# Patient Record
Sex: Female | Born: 1985 | Race: Black or African American | Hispanic: No | Marital: Married | Smoking: Never smoker
Health system: Southern US, Community
[De-identification: ages and names within clinical notes are randomized; demographics above are authoritative.]

## PROBLEM LIST (undated history)

## (undated) DIAGNOSIS — Z789 Other specified health status: Secondary | ICD-10-CM

## (undated) HISTORY — DX: Other specified health status: Z78.9

---

## 2021-11-23 ENCOUNTER — Other Ambulatory Visit: Payer: Self-pay

## 2021-11-23 ENCOUNTER — Emergency Department (HOSPITAL_COMMUNITY)
Admission: EM | Admit: 2021-11-23 | Discharge: 2021-11-23 | Disposition: A | Discharge status: Home / Self Care | Payer: Medicaid Other | Attending: Emergency Medicine | Admitting: Emergency Medicine

## 2021-11-23 DIAGNOSIS — Z3A01 Less than 8 weeks gestation of pregnancy: Secondary | ICD-10-CM | POA: Insufficient documentation

## 2021-11-23 DIAGNOSIS — Z79899 Other long term (current) drug therapy: Secondary | ICD-10-CM | POA: Diagnosis not present

## 2021-11-23 DIAGNOSIS — Z3491 Encounter for supervision of normal pregnancy, unspecified, first trimester: Secondary | ICD-10-CM

## 2021-11-23 DIAGNOSIS — O219 Vomiting of pregnancy, unspecified: Secondary | ICD-10-CM | POA: Diagnosis present

## 2021-11-23 LAB — POC URINE PREG, ED: Preg Test, Ur: POSITIVE — AB

## 2021-11-23 MED ORDER — DOXYLAMINE-PYRIDOXINE 10-10 MG PO TBEC: 1.0000 | DELAYED_RELEASE_TABLET | Freq: Every day | ORAL | 0 refills | Status: DC

## 2021-11-23 MED ORDER — PRENATAL COMPLETE 14-0.4 MG PO TABS: 1.0000 | ORAL_TABLET | Freq: Every day | ORAL | 0 refills | Status: DC

## 2021-11-23 NOTE — ED Provider Notes (Signed)
[facility]-EMERGENCY DEPT Provider Note   CSN: [id number] Arrival date & time: 11/23/21  1003     History  Chief Complaint  Patient presents with   Possible Pregnancy    Jeanne Williams is a 36 y.o. female.  35 yo F feels that she is likely pregnant.  Starting having morning sickness for the past week or so.  Last menstrual cycle was about 5 weeks ago.  Denies any pelvic pain vaginal bleeding urinary symptoms.  This would be her third pregnancy.  She has 2 children.  No abortions.   Possible Pregnancy      Home Medications Prior to Admission medications   Medication Sig Start Date End Date Taking? Authorizing Provider  Doxylamine-Pyridoxine 10-10 MG TBEC Take 1 tablet by mouth at bedtime. If nausea persists can take an additional pill in the morning 11/23/21  Yes Melene Plan, DO  Prenatal Vit-Fe Fumarate-FA (PRENATAL COMPLETE) 14-0.4 MG TABS Take 1 tablet by mouth daily. 11/23/21 01/22/22 Yes Melene Plan, DO      Allergies    Patient has no known allergies.    Review of Systems   Review of Systems  Physical Exam Updated Vital Signs BP 120/71    Pulse 83    Temp 98.1 F (36.7 C) (Oral)    Resp 18    Ht 5\' 5"  (1.651 m)    Wt 79.4 kg    SpO2 100%    BMI 29.12 kg/m  Physical Exam Vitals and nursing note reviewed.  Constitutional:      General: She is not in acute distress.    Appearance: She is well-developed. She is not diaphoretic.  HENT:     Head: Normocephalic and atraumatic.  Eyes:     Pupils: Pupils are equal, round, and reactive to light.  Cardiovascular:     Rate and Rhythm: Normal rate and regular rhythm.     Heart sounds: No murmur heard.   No friction rub. No gallop.  Pulmonary:     Effort: Pulmonary effort is normal.     Breath sounds: No wheezing or rales.  Abdominal:     General: There is no distension.     Palpations: Abdomen is soft.     Tenderness: There is no abdominal tenderness.  Musculoskeletal:        General: No  tenderness.     Cervical back: Normal range of motion and neck supple.  Skin:    General: Skin is warm and dry.  Neurological:     Mental Status: She is alert and oriented to person, place, and time.  Psychiatric:        Behavior: Behavior normal.    ED Results / Procedures / Treatments   Labs (all labs ordered are listed, but only abnormal results are displayed) Labs Reviewed  POC URINE PREG, ED - Abnormal; Notable for the following components:      Result Value   Preg Test, Ur POSITIVE (*)    All other components within normal limits    EKG None  Radiology No results found.  Procedures Procedures    Medications Ordered in ED Medications - No data to display  ED Course/ Medical Decision Making/ A&P                           Medical Decision Making Risk OTC drugs. Prescription drug management.   Patient is a 36 y.o. female with a cc of morning sickness.  Going  on for about a week.  She thinks that she is pregnant and took a home test that was positive.  They speaks [place] and so are looking for help getting Medicaid approval.  They are here for this primarily today.  She is well-appearing and nontoxic and has no abdominal discomfort.  Do not feel that she needs a urgent ultrasound.  We will give a prescription for prenatal vitamin diclegis.  Give her information for OB follow-up.  Social work consult placed.  Clinic care pregnancy test is positive.  10:44 AM:  I have discussed the diagnosis/risks/treatment options with the patient and family.  Evaluation and diagnostic testing in the emergency department does not suggest an emergent condition requiring admission or immediate intervention beyond what has been performed at this time.  They will follow up with  OB. We also discussed returning to the ED immediately if new or worsening sx occur. We discussed the sx which are most concerning (e.g., sudden worsening pain, fever, inability to tolerate by mouth, vaginal  bleeding, pelvic pain) that necessitate immediate return. Medications administered to the patient during their visit and any new prescriptions provided to the patient are listed below.  Medications given during this visit Medications - No data to display   The patient appears reasonably screen and/or stabilized for discharge and I doubt any other medical condition or other [facility] requiring further screening, evaluation, or treatment in the ED at this time prior to discharge.           Final Clinical Impression(s) / ED Diagnoses Final diagnoses:  First trimester pregnancy    Rx / DC Orders ED Discharge Orders          Ordered    Prenatal Vit-Fe Fumarate-FA (PRENATAL COMPLETE) 14-0.4 MG TABS  Daily        11/23/21 1021    Doxylamine-Pyridoxine 10-10 MG TBEC  Daily at bedtime        11/23/21 1021              Melene Plan, DO 11/23/21 1044

## 2021-11-23 NOTE — ED Notes (Signed)
I provided reinforced discharge education based off of discharge instructions. Pt acknowledged and understood my education. Pt had no further questions/concerns for provider/myself.  °

## 2021-11-23 NOTE — ED Triage Notes (Signed)
Patient brought in via POV from home. Concern for pregnancy

## 2021-11-23 NOTE — Discharge Instructions (Addendum)
Return for pelvic pain, vaginal bleeding.  Follow up with OB in the office.

## 2022-01-20 ENCOUNTER — Ambulatory Visit (INDEPENDENT_AMBULATORY_CARE_PROVIDER_SITE_OTHER): Payer: Medicaid Other

## 2022-01-20 ENCOUNTER — Ambulatory Visit: Payer: Medicaid Other

## 2022-01-20 ENCOUNTER — Other Ambulatory Visit (HOSPITAL_COMMUNITY)
Admission: RE | Admit: 2022-01-20 | Discharge: 2022-01-20 | Disposition: A | Discharge status: Home / Self Care | Payer: Medicaid Other | Source: Ambulatory Visit | Attending: Family Medicine | Admitting: Family Medicine

## 2022-01-20 ENCOUNTER — Other Ambulatory Visit: Payer: Self-pay

## 2022-01-20 VITALS — BP 119/80 | HR 77 | Wt 180.8 lb

## 2022-01-20 DIAGNOSIS — O0990 Supervision of high risk pregnancy, unspecified, unspecified trimester: Secondary | ICD-10-CM | POA: Insufficient documentation

## 2022-01-20 DIAGNOSIS — O09529 Supervision of elderly multigravida, unspecified trimester: Secondary | ICD-10-CM | POA: Insufficient documentation

## 2022-01-20 DIAGNOSIS — Z98891 History of uterine scar from previous surgery: Secondary | ICD-10-CM | POA: Insufficient documentation

## 2022-01-20 DIAGNOSIS — Z8759 Personal history of other complications of pregnancy, childbirth and the puerperium: Secondary | ICD-10-CM | POA: Insufficient documentation

## 2022-01-20 LAB — POCT URINALYSIS DIP (DEVICE)
Bilirubin Urine: NEGATIVE
Glucose, UA: NEGATIVE mg/dL
Ketones, ur: NEGATIVE mg/dL
Leukocytes,Ua: NEGATIVE
Nitrite: NEGATIVE
Protein, ur: NEGATIVE mg/dL
Specific Gravity, Urine: 1.02 (ref 1.005–1.030)
Urobilinogen, UA: 1 mg/dL (ref 0.0–1.0)
pH: 7.5 (ref 5.0–8.0)

## 2022-01-20 MED ORDER — BLOOD PRESSURE KIT DEVI: 1.0000 | Freq: Once | 0 refills | Status: DC

## 2022-01-20 MED ORDER — PRENATAL 28-0.8 MG PO TABS: 1.0000 | ORAL_TABLET | Freq: Every day | ORAL | 11 refills | Status: AC

## 2022-01-20 MED ORDER — BLOOD PRESSURE KIT DEVI: 1.0000 | Freq: Once | 0 refills | Status: AC

## 2022-01-20 NOTE — Patient Instructions (Addendum)
[facility] ?[address] [place], [place] [address] ?[phone] ?Hours: ?Sunday Closed ?Monday 9AM-6PM ?Tuesday 9AM-6PM ?Wednesday 9AM-6PM ?Thursday 9AM-6PM ?Friday           9AM-6PM ?Saturday         10 AM-1PM ? ? ? ?Safe Medications in Pregnancy  ? ?Acne:  ?Benzoyl Peroxide  ?Salicylic Acid  ? ?Backache/Headache:  ?Tylenol: 2 regular strength every 4 hours OR  ?             2 Extra strength every 6 hours  ? ?Colds/Coughs/Allergies:  ?Benadryl (alcohol free) 25 mg every 6 hours as needed  ?Breath right strips  ?Claritin  ?Cepacol throat lozenges  ?Chloraseptic throat spray  ?Cold-Eeze- up to three times per day  ?Cough drops, alcohol free  ?Flonase (by prescription only)  ?Guaifenesin  ?Mucinex  ?Robitussin DM (plain only, alcohol free)  ?Saline nasal spray/drops  ?Sudafed (pseudoephedrine) & Actifed * use only after [redacted] weeks gestation and if you do not have high blood pressure  ?Tylenol  ?Vicks Vaporub  ?Zinc lozenges  ?Zyrtec  ? ?Constipation:  ?Colace  ?Ducolax suppositories  ?Fleet enema  ?Glycerin suppositories  ?Metamucil  ?Milk of magnesia  ?Miralax  ?Senokot  ?Smooth move tea  ? ?Diarrhea:  ?Kaopectate  ?Imodium A-D  ? ?*NO pepto Bismol  ? ?Hemorrhoids:  ?Anusol  ?Anusol HC  ?Preparation H  ?Tucks  ? ?Indigestion:  ?Tums  ?Maalox  ?Mylanta  ?Zantac  ?Pepcid  ? ?Insomnia:  ?Benadryl (alcohol free) 25mg  every 6 hours as needed  ?Tylenol PM  ?Unisom, no Gelcaps  ? ?Leg Cramps:  ?Tums  ?MagGel  ? ?Nausea/Vomiting:  ?Bonine  ?Dramamine  ?Emetrol  ?Ginger extract  ?Sea bands  ?Meclizine  ?Nausea medication to take during pregnancy:  ?Unisom (doxylamine succinate 25 mg tablets) Take one tablet daily at bedtime. If symptoms are not adequately controlled, the dose can be increased to a maximum recommended dose of two tablets daily (1/2 tablet in the morning, 1/2 tablet mid-afternoon and one at bedtime).  ?Vitamin B6 100mg  tablets. Take one tablet twice a day (up to 200 mg per day).  ? ?Skin Rashes:  ?Aveeno  products  ?Benadryl cream or 25mg  every 6 hours as needed  ?Calamine Lotion  ?1% cortisone cream  ? ?Yeast infection:  ?Gyne-lotrimin 7  ?Monistat 7  ? ? ?**If taking multiple medications, please check labels to avoid duplicating the same active ingredients  ?**take medication as directed on the label  ?** Do not exceed 4000 mg of tylenol in 24 hours  ?**Do not take medications that contain aspirin or ibuprofen  ?    ?   ?  ?

## 2022-01-20 NOTE — Progress Notes (Signed)
New OB Intake ? ?Pessel Serven is here today in person for appointment. I explained I am completing New OB Intake today. Encounter completed with [employer] virtual interpreter Benjamine Mola ID [phone] and [employer] in-person interpreter Mine [place]te.  We discussed her EDD of 07/22/22 that is based on LMP of 10/15/21. Pt is G3/P2002. I reviewed her allergies, medications, Medical/Surgical/OB history, and appropriate screenings. I informed her of [facility] services. Based on history, this is a high risk pregnancy. ? ?Patient Active Problem List  ? Diagnosis Date Noted  ? Supervision of high risk pregnancy, antepartum 01/20/2022  ? History of C-section 01/20/2022  ? History of prior pregnancy with SGA newborn 01/20/2022  ? AMA (advanced maternal age) multigravida 35+ 01/20/2022  ? ?Concerns addressed today ? ?Patient reports seasonal allergy symptoms and asks which medication is okay to take while pregnant. List provided in AVS for over the counter medications. ? ?Delivery Plans:  ?Plans to deliver at [facility]. Will discuss if VBAC is option with provider at new OB appt. Pt reports prior c-section was performed because her cervix did not dilate. ? ?MyChart/Babyscripts ?Not initiated due to language barrier and time constraints of appt. ? ?Blood Pressure Cuff  ?Blood pressure cuff ordered for patient to pick-up from [employer]. Explained after first prenatal appt pt will check weekly. ? ?Anatomy US ?Explained first scheduled [place] will be around 19 weeks. Anatomy US scheduled for 02/26/22 at 1015. ? ?Labs ?Discussed Johnsie Cancel genetic screening with patient. Would like both Panorama and Horizon drawn. Routine prenatal labs with A1C drawn today.  ? ?COVID Vaccine ?Not reviewed. ? ?Is patient a CenteringPregnancy candidate? Not a candidate due to language barrier. ?  ?Is patient a Mom+Baby Combined Care candidate? Not a candidate; two living children. ? ?Social Determinants of Health ?Food Insecurity: Patient denies food  insecurity. ?WIC Referral: Patient will be offered [facility] referral by prenatal navigator at first appt.  ?Transportation: Patient denies transportation needs. ? ?First visit review ?I reviewed new OB appt with pt. I explained she will have a provider visit with physical exam. Patient will need PAP smear at first visit. Explained pt will be seen by Radene Gunning, MD at first visit; encounter routed to appropriate provider. ? ?Annabell Howells, RN ?01/20/2022  12:00 PM ? ? ?

## 2022-01-21 LAB — CBC/D/PLT+RPR+RH+ABO+RUBIGG...
Antibody Screen: NEGATIVE
Basophils Absolute: 0 10*3/uL (ref 0.0–0.2)
Basos: 1 %
EOS (ABSOLUTE): 0.3 10*3/uL (ref 0.0–0.4)
Eos: 5 %
HCV Ab: NONREACTIVE
HIV Screen 4th Generation wRfx: NONREACTIVE
Hematocrit: 37.6 % (ref 34.0–46.6)
Hemoglobin: 12.4 g/dL (ref 11.1–15.9)
Immature Grans (Abs): 0 10*3/uL (ref 0.0–0.1)
Immature Granulocytes: 0 %
Lymphocytes Absolute: 1.4 10*3/uL (ref 0.7–3.1)
Lymphs: 26 %
MCH: 30.8 pg (ref 26.6–33.0)
MCHC: 33 g/dL (ref 31.5–35.7)
MCV: 94 fL (ref 79–97)
Monocytes Absolute: 0.4 10*3/uL (ref 0.1–0.9)
Monocytes: 8 %
Neutrophils Absolute: 3.5 10*3/uL (ref 1.4–7.0)
Neutrophils: 60 %
Platelets: 207 10*3/uL (ref 150–450)
RBC: 4.02 x10E6/uL (ref 3.77–5.28)
RDW: 13.9 % (ref 11.7–15.4)
RPR Ser Ql: NONREACTIVE
Rh Factor: POSITIVE
Rubella Antibodies, IGG: 26.6 index (ref >0.99)
WBC: 5.7 10*3/uL (ref 3.4–10.8)

## 2022-01-21 LAB — GC/CHLAMYDIA PROBE AMP (~~LOC~~) NOT AT ARMC
Chlamydia: NEGATIVE
Comment: NEGATIVE
Comment: NORMAL
Neisseria Gonorrhea: NEGATIVE

## 2022-01-21 LAB — HEMOGLOBIN A1C
Est. average glucose Bld gHb Est-mCnc: 103 mg/dL
Hgb A1c MFr Bld: 5.2 % (ref 4.8–5.6)

## 2022-01-21 LAB — HCV INTERPRETATION

## 2022-01-21 LAB — HEPATITIS B SURFACE AG, CONFIRM: HBsAG Confirmation: POSITIVE — AB

## 2022-01-22 ENCOUNTER — Other Ambulatory Visit: Payer: Self-pay | Admitting: Obstetrics and Gynecology

## 2022-01-22 DIAGNOSIS — B1910 Unspecified viral hepatitis B without hepatic coma: Secondary | ICD-10-CM

## 2022-01-22 LAB — URINE CULTURE, OB REFLEX

## 2022-01-22 LAB — CULTURE, OB URINE

## 2022-01-25 ENCOUNTER — Other Ambulatory Visit: Payer: Self-pay | Admitting: *Deleted

## 2022-01-25 ENCOUNTER — Telehealth: Payer: Self-pay | Admitting: *Deleted

## 2022-01-25 ENCOUNTER — Other Ambulatory Visit: Payer: Self-pay

## 2022-01-25 DIAGNOSIS — B1910 Unspecified viral hepatitis B without hepatic coma: Secondary | ICD-10-CM

## 2022-01-25 NOTE — Telephone Encounter (Addendum)
-----   Message from Milas Hock, MD sent at 01/22/2022 11:40 AM EDT ----- ?Pt is positive for Hep B surface antigen. Can you please check with her about known history? We also need some additional labs. I will place those - I don't know if they can be added on or need to be done at future visit.  ? ?4/3  1720  Additional labs have been requested as ordered. Called pt w/[employer] interpreter [phone].  She did not answer and does not have voicemail. Pt needs to be questioned about any hepatitis history.  ?

## 2022-02-01 LAB — HEPATIC FUNCTION PANEL
ALT: 14 IU/L (ref 0–32)
AST: 21 IU/L (ref 0–40)
Albumin: 3.9 g/dL (ref 3.8–4.8)
Alkaline Phosphatase: 59 IU/L (ref 44–121)
Bilirubin Total: 0.2 mg/dL (ref 0.0–1.2)
Bilirubin, Direct: <0.1 mg/dL (ref 0.00–0.40)
Total Protein: 6.6 g/dL (ref 6.0–8.5)

## 2022-02-01 LAB — HEPATITIS B DNA, ULTRAQUANTITATIVE, PCR

## 2022-02-01 LAB — HEPATITIS B E ANTIBODY: Hep B E Ab: POSITIVE — AB

## 2022-02-01 LAB — SPECIMEN STATUS REPORT

## 2022-02-01 LAB — HEPATITIS B E ANTIGEN: Hep B E Ag: NEGATIVE

## 2022-02-01 NOTE — Progress Notes (Signed)
? ?History:  ? Jeanne Williams is a 36 y.o. G3P2002 at [redacted]w[redacted]d by LMP being seen today for her first obstetrical visit.   Patient does intend to breast feed.  ? ?Pregnancy history fully reviewed.  ? ?Obstetrical history is significant for two prior c-sections, FGR, AMA, and hepatitis B.  ? ?Patient reports no complaints. ?  ? ?  ?HISTORY: ?OB History  ?Gravida Para Term Preterm AB Living  ?3 2 2  0 0 2  ?SAB IAB Ectopic Multiple Live Births  ?0 0 0 0 2  ?  ?# Outcome Date GA Lbr Len/2nd Weight Sex Delivery Anes PTL Lv  ?3 Current           ?2 Term 05/20/20 [redacted]w[redacted]d  4 lb 10.1 oz (2.1 kg) F CS-Unspec     ?1 Term 01/19/13 [redacted]w[redacted]d  5 lb 8.2 oz (2.5 kg) F CS-Unspec  Y   ?   Complications: Failure to Progress in First Stage  ?  ? ?Last pap smear :  ?No results found for: DIAGPAP, HPV, HPVHIGH ? ? ?History reviewed. No pertinent past medical history. ?History reviewed. No pertinent surgical history. ?History reviewed. No pertinent family history. ?Social History  ? ?Tobacco Use  ? Smoking status: Never  ? Smokeless tobacco: Never  ?Substance Use Topics  ? Drug use: Never  ? ?No Known Allergies ?Current Outpatient Medications on File Prior to Visit  ?Medication Sig Dispense Refill  ? Prenatal 28-0.8 MG TABS Take 1 tablet by mouth daily. 30 tablet 11  ? ?No current facility-administered medications on file prior to visit.  ? ? ?Review of Systems ?Pertinent items noted in HPI and remainder of comprehensive ROS otherwise negative. ? ?Physical Exam:  ? ?Vitals:  ? 02/04/22 0857  ?BP: 114/85  ?Pulse: 74  ?Weight: 181 lb (82.1 kg)  ? ?Fetal Heart Rate (bpm): 160 ? ?General: well-developed, well-nourished female in no acute distress  ?Breasts:  Deferred   ?Skin: normal coloration and turgor, no rashes  ?Neurologic: oriented, normal, negative, normal mood  ?Extremities: normal strength, tone, and muscle mass, ROM of all joints is normal  ?HEENT PERRLA, extraocular movement intact and sclera clear, anicteric  ?Neck supple and no masses   ?Cardiovascular: regular rate and rhythm  ?Respiratory:  no respiratory distress, normal breath sounds  ?Abdomen: soft, non-tender; bowel sounds normal; no masses,  no organomegaly  ?Pelvic: Deferred  ?  ?Assessment:  ?  ?Pregnancy: G1P0 ?Patient Active Problem List  ? Diagnosis Date Noted  ? Supervision of high risk pregnancy, antepartum 01/20/2022  ? History of C-section 01/20/2022  ? History of prior pregnancy with SGA newborn 01/20/2022  ? AMA (advanced maternal age) multigravida 35+ 01/20/2022  ? ?  ?Plan:  ?  ?1. Supervision of high risk pregnancy, antepartum ?Anatomy scheduled for 5/5 ?Will do MSAFP ?Check HBV labs and refer to ID. Appt made ? ?2. History of C-section ?- We discussed her history of c-section.  Prior c-section due to lack of dilation per pt report. Second c-section was scheduled.  .  She has a history of  no prior successful vaginal deliveries ?- We discussed the risks associated with repeat c-section: bleeding, infection, injury to surrounding organs/tissues I.e. bowel/bladder, development of scar tissue, wound complications such as wound separation or infection, need for additional surgery, percreta/acreta ?- We discussed the risks associated with TOLAC: risk of it being unsuccessful, specially in the context of her history, the risks in general of a vaginal delivery (prolapse, SUI, differences in recovery, pelvic  floor dysfunction, etc), and the risk of uterine rupture. We discussed with the risk of uterine rupture that while rare it is not easily predicted, that it is a surgical emergency, and it can be potentially catastrophic for mom and baby. We discussed if uterine rupture that it may necessitate hysterectomy if the rupture caused issues with bleeding that could not be managed with other surgical options.  ?- After counseling, the patient was given the opportunity to ask questions and all questions answered.  ?- After considering her options, she would like take some time to consider  the options ?- Information provided to the patient ? ? ?3. History of prior pregnancy with SGA newborn ?Will follow serial growth in third trimester ? ?4. Antepartum multigravida of advanced maternal age ?NIPT wnl. Result not yet in the chart but reviewed by Marylynn Pearson, RN.  ?Continue prenatal vitamins. ?Problem list reviewed and updated. ?Genetic Screening discussed, NIPS: ordered and normal ?Ultrasound discussed; fetal anatomic survey:  scheduled for 5/5.  ?Anticipatory guidance about prenatal visits given including labs, ultrasounds, and testing. ?Discussed usage of Babyscripts and virtual visits ? ?The nature of [facility]Faculty Practice with multiple MDs and Advanced Practice Providers was explained to patient; also emphasized that residents, students are part of our team. ?Routine obstetric precautions reviewed. Encouraged to seek out care at office or emergency room [facility] preferred) for urgent and/or emergent concerns. ?Return in about 4 weeks (around 03/04/2022) for OB VISIT, MD only.  ?  ?Milas Hock, MD, FACOG ?Obstetrician Heritage manager, Faculty Practice ?Center for [employer], [facility] ? ?

## 2022-02-02 NOTE — Telephone Encounter (Signed)
Called patient with [facility] interpreter [place] ID [phone]. Pt reports no known history of hepatitis. Desires to complete needed labs at upcoming appt. Pt expresses concern about hepatitis. Explained this is infection of the liver. Explained her provider will talk more with her about treatment for this and how this will effect care needed during pregnancy. ?

## 2022-02-04 ENCOUNTER — Encounter: Payer: Self-pay | Admitting: Obstetrics and Gynecology

## 2022-02-04 ENCOUNTER — Ambulatory Visit (INDEPENDENT_AMBULATORY_CARE_PROVIDER_SITE_OTHER): Payer: Self-pay | Admitting: Obstetrics and Gynecology

## 2022-02-04 VITALS — BP 114/85 | HR 74 | Wt 181.0 lb

## 2022-02-04 DIAGNOSIS — O09529 Supervision of elderly multigravida, unspecified trimester: Secondary | ICD-10-CM

## 2022-02-04 DIAGNOSIS — O0990 Supervision of high risk pregnancy, unspecified, unspecified trimester: Secondary | ICD-10-CM

## 2022-02-04 DIAGNOSIS — O98412 Viral hepatitis complicating pregnancy, second trimester: Secondary | ICD-10-CM

## 2022-02-04 DIAGNOSIS — Z8759 Personal history of other complications of pregnancy, childbirth and the puerperium: Secondary | ICD-10-CM

## 2022-02-04 DIAGNOSIS — B1910 Unspecified viral hepatitis B without hepatic coma: Secondary | ICD-10-CM

## 2022-02-04 DIAGNOSIS — Z98891 History of uterine scar from previous surgery: Secondary | ICD-10-CM

## 2022-02-04 MED ORDER — DOXYLAMINE-PYRIDOXINE 10-10 MG PO TBEC
1.0000 | DELAYED_RELEASE_TABLET | Freq: Every day | ORAL | 0 refills | Status: DC
Start: 2022-02-04 — End: 2022-04-26

## 2022-02-04 NOTE — Patient Instructions (Signed)
[facility] ?Address: [address], [place], [place] [address] ?Phone: [phone] ? ?Appointment Tuesday, April 18th at 10:00am ?

## 2022-02-06 LAB — AFP, SERUM, OPEN SPINA BIFIDA
AFP MoM: 1.63
AFP Value: 55.1 ng/mL
Gest. Age on Collection Date: 16 weeks
Maternal Age At EDD: 35.2 yr
OSBR Risk 1 IN: 3889
Test Results:: NEGATIVE
Weight: 181 [lb_av]

## 2022-02-06 LAB — HEPATITIS B DNA, ULTRAQUANTITATIVE, PCR
HBV DNA SERPL PCR-ACNC: 220 IU/mL
HBV DNA SERPL PCR-LOG IU: 2.342 log10 IU/mL

## 2022-02-09 ENCOUNTER — Ambulatory Visit (INDEPENDENT_AMBULATORY_CARE_PROVIDER_SITE_OTHER): Payer: Medicaid Other | Admitting: Internal Medicine

## 2022-02-09 ENCOUNTER — Other Ambulatory Visit: Payer: Self-pay

## 2022-02-09 ENCOUNTER — Encounter: Payer: Self-pay | Admitting: Internal Medicine

## 2022-02-09 DIAGNOSIS — B181 Chronic viral hepatitis B without delta-agent: Secondary | ICD-10-CM | POA: Diagnosis present

## 2022-02-09 NOTE — Progress Notes (Signed)
?    [facility]  ? ? ? ? ?Reason for Consult: chronic hepatitis B    ?Referring Physician: Dr. Para March ? ? ? Patient ID: Jeanne Williams, female    DOB: 1986/02/14, 36 y.o.   MRN: [id number] ? ?HPI:   ?She is here for evaluation of known positive hepatitis B.  ?This is her third pregnancy and no records of previous pregnancies available. Her previous two pregnancies were in her home country and she was never told anything about being hepatitis B positive or knows of any testing.  She is unaware of her mother having hepatitis B and is living in [place].  No known liver issues in her family, no known cirrhosis or hepatocellular carcinoma.  No history of testing or any blood transfusions.  AST and ALT wnl.  HIV negative.  ? ?PMH: chronic hepatitis B ? ?Prior to Admission medications   ?Medication Sig Start Date End Date Taking? Authorizing Provider  ?Doxylamine-Pyridoxine 10-10 MG TBEC Take 1 tablet by mouth at bedtime. If nausea persists can take an additional pill in the morning 02/04/22   Milas Hock, MD  ?Prenatal 28-0.8 MG TABS Take 1 tablet by mouth daily. 01/20/22   Milas Hock, MD  ? ? ?No Known Allergies ? ?Social History  ? ?Tobacco Use  ? Smoking status: Never  ? Smokeless tobacco: Never  ?Substance Use Topics  ? Drug use: Never  ? ?FMH: no liver disease ? ?Review of Systems ? Constitutional: negative for fatigue and malaise ?Integument/breast: negative for rash ?All other systems reviewed and are negative   ? ?Constitutional: in no apparent distress There were no vitals filed for this visit. ?EYES: anicteric ?Respiratory: normal respiratory effort ?GI: gravid ?Musculoskeletal: no edema ?Skin: no rash ? ?Labs: ?Lab Results  ?Component Value Date  ? WBC 5.7 01/20/2022  ? HGB 12.4 01/20/2022  ? HCT 37.6 01/20/2022  ? MCV 94 01/20/2022  ? PLT 207 01/20/2022  ? No results found for: CREATININE, BUN, NA, K, CL, CO2  ?Lab Results  ?Component Value Date  ? ALT 14 01/20/2022  ? AST 21  01/20/2022  ? ALKPHOS 59 01/20/2022  ? BILITOT 0.2 01/20/2022  ?  ? ?Assessment: chronic, inactive hepatitis B.  I discussed the natural history of hepatitis B, indications for treatment of the mother in pregnancy and indications for treatment after pregnancy.  I also discussed the risk of HCC and need for screening. ?At this time, her viral load is minimal so no indication for treatment of the mother.   ? ?Plan: ?1)  rtc in 10 weeks for repeat HBV DNA, hepatic function panel and will check HDV ?She can then return after pregnancy for routine monitoring.  ?

## 2022-02-10 ENCOUNTER — Telehealth: Payer: Self-pay | Admitting: *Deleted

## 2022-02-10 NOTE — Telephone Encounter (Addendum)
-----   Message from Radene Gunning, MD sent at 02/10/2022  8:46 AM EDT ----- ?Please inform pt of normal MSAFP - thanks, pad ? ?4/19  1346  Called pt and heard message stating that the call cannot be completed @ this time. Pt does not have voicemail or Mychart set up. She needs to be informed of normal MSAFP, Panorama and Horizon tests.  ?

## 2022-02-10 NOTE — Telephone Encounter (Signed)
Called pt with [place] # [phone] and pt informed of normal results. Pt verbalized understanding.  ? ?Glenham Antolin,RN  ?02/10/22 ?

## 2022-02-26 ENCOUNTER — Other Ambulatory Visit: Payer: Self-pay | Admitting: *Deleted

## 2022-02-26 ENCOUNTER — Ambulatory Visit: Payer: Medicaid Other | Attending: Obstetrics and Gynecology

## 2022-02-26 ENCOUNTER — Encounter: Payer: Self-pay | Admitting: *Deleted

## 2022-02-26 ENCOUNTER — Ambulatory Visit: Payer: Medicaid Other | Admitting: *Deleted

## 2022-02-26 VITALS — BP 114/67 | HR 78

## 2022-02-26 DIAGNOSIS — O0990 Supervision of high risk pregnancy, unspecified, unspecified trimester: Secondary | ICD-10-CM

## 2022-02-26 DIAGNOSIS — O09522 Supervision of elderly multigravida, second trimester: Secondary | ICD-10-CM

## 2022-02-26 DIAGNOSIS — O09529 Supervision of elderly multigravida, unspecified trimester: Secondary | ICD-10-CM | POA: Diagnosis present

## 2022-02-26 DIAGNOSIS — Z8759 Personal history of other complications of pregnancy, childbirth and the puerperium: Secondary | ICD-10-CM | POA: Insufficient documentation

## 2022-02-26 DIAGNOSIS — O4442 Low lying placenta NOS or without hemorrhage, second trimester: Secondary | ICD-10-CM

## 2022-02-26 DIAGNOSIS — O34219 Maternal care for unspecified type scar from previous cesarean delivery: Secondary | ICD-10-CM

## 2022-03-01 ENCOUNTER — Encounter: Payer: Self-pay | Admitting: Obstetrics and Gynecology

## 2022-03-01 DIAGNOSIS — O4440 Low lying placenta NOS or without hemorrhage, unspecified trimester: Secondary | ICD-10-CM | POA: Insufficient documentation

## 2022-03-03 ENCOUNTER — Other Ambulatory Visit: Payer: Self-pay | Admitting: *Deleted

## 2022-03-03 DIAGNOSIS — O09522 Supervision of elderly multigravida, second trimester: Secondary | ICD-10-CM

## 2022-03-03 DIAGNOSIS — O34219 Maternal care for unspecified type scar from previous cesarean delivery: Secondary | ICD-10-CM

## 2022-03-03 DIAGNOSIS — Z8759 Personal history of other complications of pregnancy, childbirth and the puerperium: Secondary | ICD-10-CM

## 2022-03-03 DIAGNOSIS — O4440 Low lying placenta NOS or without hemorrhage, unspecified trimester: Secondary | ICD-10-CM

## 2022-03-04 ENCOUNTER — Other Ambulatory Visit (HOSPITAL_COMMUNITY)
Admission: RE | Admit: 2022-03-04 | Discharge: 2022-03-04 | Disposition: A | Discharge status: Home / Self Care | Payer: Medicaid Other | Source: Ambulatory Visit | Attending: Family Medicine | Admitting: Family Medicine

## 2022-03-04 ENCOUNTER — Ambulatory Visit (INDEPENDENT_AMBULATORY_CARE_PROVIDER_SITE_OTHER): Payer: Medicaid Other | Admitting: Family Medicine

## 2022-03-04 VITALS — BP 115/72 | HR 72 | Wt 183.4 lb

## 2022-03-04 DIAGNOSIS — Z98891 History of uterine scar from previous surgery: Secondary | ICD-10-CM

## 2022-03-04 DIAGNOSIS — Z124 Encounter for screening for malignant neoplasm of cervix: Secondary | ICD-10-CM | POA: Insufficient documentation

## 2022-03-04 DIAGNOSIS — B181 Chronic viral hepatitis B without delta-agent: Secondary | ICD-10-CM

## 2022-03-04 DIAGNOSIS — O0990 Supervision of high risk pregnancy, unspecified, unspecified trimester: Secondary | ICD-10-CM

## 2022-03-04 DIAGNOSIS — O09522 Supervision of elderly multigravida, second trimester: Secondary | ICD-10-CM

## 2022-03-04 DIAGNOSIS — O4440 Low lying placenta NOS or without hemorrhage, unspecified trimester: Secondary | ICD-10-CM

## 2022-03-04 NOTE — Patient Instructions (Signed)
For cold and allergy symptoms (blocked nose) ? ?You may take Mucinex, (Allegra, Claritin, Zyrtec, Xyzal, Clarinex-any one of these--they are the same class--same as Benadryl), Sudafed (must be behind the counter, should not be phenylephrine), saline or steroid (Nasacort, Nasonex and Flonase) nasal sprays. ? ?Try Heat to back and tylenol ?

## 2022-03-04 NOTE — Progress Notes (Signed)
? ?  PRENATAL VISIT NOTE ? ?Subjective:  ?Jeanne Williams is a 36 y.o. G3P2002 at [redacted]w[redacted]d being seen today for ongoing prenatal care.  She is currently monitored for the following issues for this low-risk pregnancy and has Supervision of high risk pregnancy, antepartum; History of C-section; History of prior pregnancy with SGA newborn; AMA (advanced maternal age) multigravida 35+; Chronic viral hepatitis B without delta-agent (HCC); and Low-lying placenta on their problem list. ? ?Patient reports no complaints.  Contractions: Not present. Vag. Bleeding: None.  Movement: Present. Denies leaking of fluid.  ? ?The following portions of the patient's history were reviewed and updated as appropriate: allergies, current medications, past family history, past medical history, past social history, past surgical history and problem list.  ? ?Objective:  ? ?Vitals:  ? 03/04/22 1040  ?BP: 115/72  ?Pulse: 72  ?Weight: 183 lb 6.4 oz (83.2 kg)  ? ? ?Fetal Status: Fetal Heart Rate (bpm): 156 Fundal Height: 22 cm Movement: Present    ? ?General:  Alert, oriented and cooperative. Patient is in no acute distress.  ?Skin: Skin is warm and dry. No rash noted.   ?Cardiovascular: Normal heart rate noted  ?Respiratory: Normal respiratory effort, no problems with respiration noted  ?Abdomen: Soft, gravid, appropriate for gestational age.  Pain/Pressure: Present     ?Pelvic: Cervical exam deferred        ?Extremities: Normal range of motion.  Edema: None  ?Mental Status: Normal mood and affect. Normal behavior. Normal judgment and thought content.  ? ?Assessment and Plan:  ?Pregnancy: [id number] at [redacted]w[redacted]d ?1. Chronic viral hepatitis B without delta agent and without coma (HCC) ?Has seen ID--has f/u ? ?2. Multigravida of advanced maternal age in second trimester ?LR NIPT ? ?3. History of C-section ?Likely for RCS ? ?4. Low-lying placenta ?Will need repeat u/s ?Bleeding precautions ? ?5. Supervision of high risk pregnancy, antepartum ?Continue  routine prenatal care. ? ?6. Papanicolaou smear for cervical cancer screening ?- Cytology - PAP( [place]) ? ?Preterm labor symptoms and general obstetric precautions including but not limited to vaginal bleeding, contractions, leaking of fluid and fetal movement were reviewed in detail with the patient. ?Please refer to After Visit Summary for other counseling recommendations.  ? ?Return in about 4 weeks (around 04/01/2022) for [facility]. ? ?Future Appointments  ?Date Time Provider Department Center  ?03/26/2022  9:15 AM [facility] NURSE [facility]  ?03/26/2022  9:30 AM [facility] US2 [facility]  ?04/07/2022  9:35 AM Warden Fillers, MD [facility]  ?04/21/2022  4:00 PM Comer, Belia Heman, MD [facility]  ?04/23/2022 10:45 AM [facility] NURSE [facility]  ?04/23/2022 11:00 AM [facility] US1 [facility]  ? ? ?Reva Bores, MD ? ?

## 2022-03-08 LAB — CYTOLOGY - PAP
Adequacy: ABSENT
Comment: NEGATIVE
Diagnosis: NEGATIVE
High risk HPV: NEGATIVE

## 2022-03-26 ENCOUNTER — Ambulatory Visit: Payer: Medicaid Other | Admitting: *Deleted

## 2022-03-26 ENCOUNTER — Encounter: Payer: Self-pay | Admitting: *Deleted

## 2022-03-26 ENCOUNTER — Ambulatory Visit: Payer: Medicaid Other | Attending: Maternal & Fetal Medicine

## 2022-03-26 ENCOUNTER — Other Ambulatory Visit: Payer: Self-pay | Admitting: *Deleted

## 2022-03-26 VITALS — BP 95/53 | HR 81

## 2022-03-26 DIAGNOSIS — B1910 Unspecified viral hepatitis B without hepatic coma: Secondary | ICD-10-CM

## 2022-03-26 DIAGNOSIS — O34219 Maternal care for unspecified type scar from previous cesarean delivery: Secondary | ICD-10-CM | POA: Diagnosis not present

## 2022-03-26 DIAGNOSIS — O09522 Supervision of elderly multigravida, second trimester: Secondary | ICD-10-CM

## 2022-03-26 DIAGNOSIS — O0990 Supervision of high risk pregnancy, unspecified, unspecified trimester: Secondary | ICD-10-CM | POA: Diagnosis present

## 2022-03-26 DIAGNOSIS — O09292 Supervision of pregnancy with other poor reproductive or obstetric history, second trimester: Secondary | ICD-10-CM | POA: Diagnosis not present

## 2022-03-26 DIAGNOSIS — O98412 Viral hepatitis complicating pregnancy, second trimester: Secondary | ICD-10-CM

## 2022-03-26 DIAGNOSIS — Z3A23 23 weeks gestation of pregnancy: Secondary | ICD-10-CM

## 2022-03-26 DIAGNOSIS — Z8759 Personal history of other complications of pregnancy, childbirth and the puerperium: Secondary | ICD-10-CM

## 2022-03-26 DIAGNOSIS — O4442 Low lying placenta NOS or without hemorrhage, second trimester: Secondary | ICD-10-CM | POA: Diagnosis not present

## 2022-04-07 ENCOUNTER — Ambulatory Visit (INDEPENDENT_AMBULATORY_CARE_PROVIDER_SITE_OTHER): Payer: Medicaid Other | Admitting: Obstetrics and Gynecology

## 2022-04-07 VITALS — BP 115/74 | HR 82 | Wt 186.0 lb

## 2022-04-07 DIAGNOSIS — B181 Chronic viral hepatitis B without delta-agent: Secondary | ICD-10-CM

## 2022-04-07 DIAGNOSIS — Z8759 Personal history of other complications of pregnancy, childbirth and the puerperium: Secondary | ICD-10-CM

## 2022-04-07 DIAGNOSIS — Z98891 History of uterine scar from previous surgery: Secondary | ICD-10-CM

## 2022-04-07 DIAGNOSIS — O09522 Supervision of elderly multigravida, second trimester: Secondary | ICD-10-CM

## 2022-04-07 DIAGNOSIS — Z3A24 24 weeks gestation of pregnancy: Secondary | ICD-10-CM

## 2022-04-07 DIAGNOSIS — O0990 Supervision of high risk pregnancy, unspecified, unspecified trimester: Secondary | ICD-10-CM

## 2022-04-07 NOTE — Progress Notes (Signed)
   PRENATAL VISIT NOTE  Subjective:  Jeanne Williams is a 36 y.o. G3P2002 at [redacted]w[redacted]d being seen today for ongoing prenatal care.  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  LLP has resolved.  Patient doing well with no acute concerns today. She reports no complaints.  Contractions: Not present. Vag. Bleeding: None.  Movement: Present. Denies leaking of fluid.   The following portions of the patient's history were reviewed and updated as appropriate: allergies, current medications, past family history, past medical history, past social history, past surgical history and problem list. Problem list updated.  Objective:   Vitals:   04/07/22 0941  BP: 115/74  Pulse: 82  Weight: 186 lb (84.4 kg)    Fetal Status: Fetal Heart Rate (bpm): 158 Fundal Height: 24 cm Movement: Present     General:  Alert, oriented and cooperative. Patient is in no acute distress.  Skin: Skin is warm and dry. No rash noted.   Cardiovascular: Normal heart rate noted  Respiratory: Normal respiratory effort, no problems with respiration noted  Abdomen: Soft, gravid, appropriate for gestational age.  Pain/Pressure: Present     Pelvic: Cervical exam deferred        Extremities: Normal range of motion.     Mental Status:  Normal mood and affect. Normal behavior. Normal judgment and thought content.   Assessment and Plan:  Pregnancy: G3P2002 at [redacted]w[redacted]d  1. [redacted] weeks gestation of pregnancy   2. Chronic viral hepatitis B without delta agent and without coma (HCC) Appears to have relatively low viral RNA, pt followed by ID  3. Supervision of high risk pregnancy, antepartum Continue routine prenatal care 28 week labs next  visit  4. History of C-section Hx of c/s x 2, pt desires repeat with BTL, sign both consents at next visit  5. History of prior pregnancy with SGA newborn Pt has growth scan on 04/23/22  6. Multigravida of advanced maternal age in second trimester   Preterm labor symptoms and general obstetric precautions including but not limited to vaginal bleeding, contractions, leaking of fluid and fetal movement were reviewed in detail with the patient.  Please refer to After Visit Summary for other counseling recommendations.   Return in about 3 weeks (around 04/28/2022) for [facility], in person, 2 hr GTT, 3rd trim labs.   Lynnda Shields, MD Faculty Attending Center for [facility]

## 2022-04-14 ENCOUNTER — Other Ambulatory Visit: Payer: Self-pay | Admitting: Obstetrics and Gynecology

## 2022-04-21 ENCOUNTER — Other Ambulatory Visit: Payer: Self-pay

## 2022-04-21 ENCOUNTER — Ambulatory Visit (INDEPENDENT_AMBULATORY_CARE_PROVIDER_SITE_OTHER): Payer: Medicaid Other | Admitting: Internal Medicine

## 2022-04-21 ENCOUNTER — Encounter: Payer: Self-pay | Admitting: Internal Medicine

## 2022-04-21 VITALS — BP 109/73 | HR 77 | Temp 97.9°F | Wt 185.0 lb

## 2022-04-21 DIAGNOSIS — B181 Chronic viral hepatitis B without delta-agent: Secondary | ICD-10-CM

## 2022-04-22 ENCOUNTER — Encounter: Payer: Self-pay | Admitting: Internal Medicine

## 2022-04-22 NOTE — Assessment & Plan Note (Addendum)
Will recheck her DNA today to assure it is still low/minimal.  She will then follow up after delivery for labs and will do an elastography at that time. Will check a delta Ab as well.   I have personally spent 20 minutes involved in face-to-face and non-face-to-face activities for this patient on the day of the visit. Professional time spent includes the following activities: Preparing to see the patient (review of tests), Obtaining and/or reviewing separately obtained history (admission/discharge record), Performing a medically appropriate examination and/or evaluation , Ordering medications/tests/procedures, referring and communicating with other health care professionals, Documenting clinical information in the EMR, Independently interpreting results (not separately reported), Communicating results to the patient/family/caregiver, Counseling and educating the patient/family/caregiver and Care coordination (not separately reported).

## 2022-04-22 NOTE — Progress Notes (Signed)
   Subjective:    Patient ID: Jeanne Williams, female    DOB: 10-Jun-1986, 36 y.o.   MRN: [id number]  HPI Here for follow up of chronic inactive hepatitis B She is pregnant and is E Ag negative, no transaminitis and HBV DNA on 220 IU/mL.  No treatment indicated.   Here for follow up.  No new issues.    Review of Systems  Constitutional:  Negative for fatigue.  Gastrointestinal:  Negative for diarrhea and nausea.  Skin:  Negative for rash.       Objective:   Physical Exam Eyes:     General: No scleral icterus. Pulmonary:     Effort: Pulmonary effort is normal.  Neurological:     Mental Status: She is alert.  Psychiatric:        Mood and Affect: Mood normal.           Assessment & Plan:

## 2022-04-23 ENCOUNTER — Ambulatory Visit: Payer: Medicaid Other | Admitting: *Deleted

## 2022-04-23 ENCOUNTER — Ambulatory Visit: Payer: Medicaid Other | Attending: Maternal & Fetal Medicine

## 2022-04-23 ENCOUNTER — Other Ambulatory Visit: Payer: Self-pay | Admitting: *Deleted

## 2022-04-23 VITALS — BP 107/55 | HR 68

## 2022-04-23 DIAGNOSIS — O09292 Supervision of pregnancy with other poor reproductive or obstetric history, second trimester: Secondary | ICD-10-CM | POA: Diagnosis not present

## 2022-04-23 DIAGNOSIS — O98412 Viral hepatitis complicating pregnancy, second trimester: Secondary | ICD-10-CM | POA: Diagnosis not present

## 2022-04-23 DIAGNOSIS — O0990 Supervision of high risk pregnancy, unspecified, unspecified trimester: Secondary | ICD-10-CM | POA: Insufficient documentation

## 2022-04-23 DIAGNOSIS — O09522 Supervision of elderly multigravida, second trimester: Secondary | ICD-10-CM

## 2022-04-23 DIAGNOSIS — O34219 Maternal care for unspecified type scar from previous cesarean delivery: Secondary | ICD-10-CM | POA: Insufficient documentation

## 2022-04-23 DIAGNOSIS — O4440 Low lying placenta NOS or without hemorrhage, unspecified trimester: Secondary | ICD-10-CM | POA: Diagnosis present

## 2022-04-23 DIAGNOSIS — Z8759 Personal history of other complications of pregnancy, childbirth and the puerperium: Secondary | ICD-10-CM | POA: Insufficient documentation

## 2022-04-23 DIAGNOSIS — B1910 Unspecified viral hepatitis B without hepatic coma: Secondary | ICD-10-CM

## 2022-04-23 DIAGNOSIS — Z3A27 27 weeks gestation of pregnancy: Secondary | ICD-10-CM

## 2022-04-25 LAB — HEPATITIS B DNA, ULTRAQUANTITATIVE, PCR
Hepatitis B DNA: 885 IU/mL — ABNORMAL HIGH
Hepatitis B virus DNA: 2.95 Log IU/mL — ABNORMAL HIGH

## 2022-04-25 LAB — HEPATIC FUNCTION PANEL
AG Ratio: 1 (calc) (ref 1.0–2.5)
ALT: 12 U/L (ref 6–29)
AST: 17 U/L (ref 10–30)
Albumin: 3.4 g/dL — ABNORMAL LOW (ref 3.6–5.1)
Alkaline phosphatase (APISO): 67 U/L (ref 31–125)
Bilirubin, Direct: 0.1 mg/dL (ref 0.0–0.2)
Globulin: 3.4 g/dL (calc) (ref 1.9–3.7)
Indirect Bilirubin: 0.2 mg/dL (calc) (ref 0.2–1.2)
Total Bilirubin: 0.3 mg/dL (ref 0.2–1.2)
Total Protein: 6.8 g/dL (ref 6.1–8.1)

## 2022-04-25 LAB — HEPATITIS DELTA ANTIBODY: Hepatitis D Ab, Total: NEGATIVE

## 2022-04-26 ENCOUNTER — Other Ambulatory Visit: Payer: Self-pay | Admitting: Obstetrics and Gynecology

## 2022-04-26 MED ORDER — DOXYLAMINE-PYRIDOXINE 10-10 MG PO TBEC: 1.0000 | DELAYED_RELEASE_TABLET | Freq: Every day | ORAL | 0 refills | Status: DC

## 2022-04-30 ENCOUNTER — Ambulatory Visit: Payer: Medicaid Other | Admitting: *Deleted

## 2022-04-30 ENCOUNTER — Ambulatory Visit: Payer: Medicaid Other | Attending: Obstetrics and Gynecology

## 2022-04-30 ENCOUNTER — Other Ambulatory Visit: Payer: Self-pay | Admitting: General Practice

## 2022-04-30 VITALS — BP 112/67 | HR 63

## 2022-04-30 DIAGNOSIS — O09293 Supervision of pregnancy with other poor reproductive or obstetric history, third trimester: Secondary | ICD-10-CM | POA: Diagnosis not present

## 2022-04-30 DIAGNOSIS — O98413 Viral hepatitis complicating pregnancy, third trimester: Secondary | ICD-10-CM

## 2022-04-30 DIAGNOSIS — Z8759 Personal history of other complications of pregnancy, childbirth and the puerperium: Secondary | ICD-10-CM | POA: Insufficient documentation

## 2022-04-30 DIAGNOSIS — O98419 Viral hepatitis complicating pregnancy, unspecified trimester: Secondary | ICD-10-CM | POA: Insufficient documentation

## 2022-04-30 DIAGNOSIS — O09529 Supervision of elderly multigravida, unspecified trimester: Secondary | ICD-10-CM

## 2022-04-30 DIAGNOSIS — B1910 Unspecified viral hepatitis B without hepatic coma: Secondary | ICD-10-CM | POA: Insufficient documentation

## 2022-04-30 DIAGNOSIS — O0990 Supervision of high risk pregnancy, unspecified, unspecified trimester: Secondary | ICD-10-CM | POA: Insufficient documentation

## 2022-04-30 DIAGNOSIS — O34219 Maternal care for unspecified type scar from previous cesarean delivery: Secondary | ICD-10-CM | POA: Diagnosis not present

## 2022-04-30 DIAGNOSIS — O26893 Other specified pregnancy related conditions, third trimester: Secondary | ICD-10-CM

## 2022-04-30 DIAGNOSIS — Z3A28 28 weeks gestation of pregnancy: Secondary | ICD-10-CM

## 2022-04-30 DIAGNOSIS — O4443 Low lying placenta NOS or without hemorrhage, third trimester: Secondary | ICD-10-CM

## 2022-04-30 MED ORDER — OMEPRAZOLE 20 MG PO CPDR: 20.0000 mg | DELAYED_RELEASE_CAPSULE | Freq: Every day | ORAL | 1 refills | Status: DC

## 2022-04-30 NOTE — Progress Notes (Signed)
Patient came by office reporting terrible heartburn & tums isn't helping. Prilosec sent to pharmacy per protocol.

## 2022-05-04 ENCOUNTER — Other Ambulatory Visit: Payer: Self-pay

## 2022-05-04 DIAGNOSIS — O0990 Supervision of high risk pregnancy, unspecified, unspecified trimester: Secondary | ICD-10-CM

## 2022-05-05 ENCOUNTER — Other Ambulatory Visit: Payer: Medicaid Other

## 2022-05-05 ENCOUNTER — Other Ambulatory Visit: Payer: Self-pay

## 2022-05-05 ENCOUNTER — Ambulatory Visit (INDEPENDENT_AMBULATORY_CARE_PROVIDER_SITE_OTHER): Payer: Medicaid Other | Admitting: Obstetrics and Gynecology

## 2022-05-05 VITALS — BP 118/77 | HR 70 | Wt 186.6 lb

## 2022-05-05 DIAGNOSIS — Z789 Other specified health status: Secondary | ICD-10-CM | POA: Insufficient documentation

## 2022-05-05 DIAGNOSIS — Z3A28 28 weeks gestation of pregnancy: Secondary | ICD-10-CM | POA: Diagnosis not present

## 2022-05-05 DIAGNOSIS — Z8759 Personal history of other complications of pregnancy, childbirth and the puerperium: Secondary | ICD-10-CM

## 2022-05-05 DIAGNOSIS — Z23 Encounter for immunization: Secondary | ICD-10-CM

## 2022-05-05 DIAGNOSIS — O0990 Supervision of high risk pregnancy, unspecified, unspecified trimester: Secondary | ICD-10-CM

## 2022-05-05 DIAGNOSIS — B181 Chronic viral hepatitis B without delta-agent: Secondary | ICD-10-CM

## 2022-05-05 DIAGNOSIS — Z98891 History of uterine scar from previous surgery: Secondary | ICD-10-CM

## 2022-05-05 DIAGNOSIS — O09523 Supervision of elderly multigravida, third trimester: Secondary | ICD-10-CM

## 2022-05-05 NOTE — Progress Notes (Signed)
   PRENATAL VISIT NOTE  Subjective:  Jeanne Williams is a 36 y.o. G3P2002 at [redacted]w[redacted]d being seen today for ongoing prenatal care.  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  Patient reports no complaints.  Contractions: Not present. Vag. Bleeding: None.  Movement: Present. Denies leaking of fluid.   The following portions of the patient's history were reviewed and updated as appropriate: allergies, current medications, past family history, past medical history, past social history, past surgical history and problem list.   Objective:   Vitals:   05/05/22 0833  BP: 118/77  Pulse: 70  Weight: 186 lb 9.6 oz (84.6 kg)    Fetal Status: Fetal Heart Rate (bpm): 160   Movement: Present     General:  Alert, oriented and cooperative. Patient is in no acute distress.  Skin: Skin is warm and dry. No rash noted.   Cardiovascular: Normal heart rate noted  Respiratory: Normal respiratory effort, no problems with respiration noted  Abdomen: Soft, gravid, appropriate for gestational age.  Pain/Pressure: Present     Pelvic: Cervical exam deferred        Extremities: Normal range of motion.  Edema: None  Mental Status: Normal mood and affect. Normal behavior. Normal judgment and thought content.   Assessment and Plan:  Pregnancy: G3P2002 at [redacted]w[redacted]d 1. [redacted] weeks gestation of pregnancy BTL papers today - Tdap vaccine greater than or equal to 7yo IM  2. History of C-section 39wk rpt and btl request sent for pt. D/w her re: BTL and permanency, etc with it Prior two c-sections in [place] and she states they didn't mention any issues with prior ones  3. History of prior pregnancy with SGA newborn Normal growth  scans. F/u rpt u/s with mfm  4. Multigravida of advanced maternal age in third trimester  5. Chronic viral hepatitis B without delta agent and without coma (HCC) Followed by ID. 6/28 labs in acceptable range  6. Supervision of high risk pregnancy, antepartum  7. Language barrier Interpreter used  Preterm labor symptoms and general obstetric precautions including but not limited to vaginal bleeding, contractions, leaking of fluid and fetal movement were reviewed in detail with the patient. Please refer to After Visit Summary for other counseling recommendations.   No follow-ups on file.  Future Appointments  Date Time Provider Department Center  05/05/2022  8:50 AM [facility] LAB [facility]  05/28/2022  3:30 PM [facility] NURSE [facility]  05/28/2022  3:45 PM [facility] US6 [facility]  06/02/2022 10:35 AM Warden Fillers, MD [facility]  09/22/2022  4:15 PM Comer, Belia Heman, MD [facility]    [place] Bing, MD

## 2022-05-06 LAB — CBC
Hematocrit: 33.9 % — ABNORMAL LOW (ref 34.0–46.6)
Hemoglobin: 11.5 g/dL (ref 11.1–15.9)
MCH: 33.3 pg — ABNORMAL HIGH (ref 26.6–33.0)
MCHC: 33.9 g/dL (ref 31.5–35.7)
MCV: 98 fL — ABNORMAL HIGH (ref 79–97)
Platelets: 165 10*3/uL (ref 150–450)
RBC: 3.45 x10E6/uL — ABNORMAL LOW (ref 3.77–5.28)
RDW: 11.8 % (ref 11.7–15.4)
WBC: 6 10*3/uL (ref 3.4–10.8)

## 2022-05-06 LAB — RPR: RPR Ser Ql: NONREACTIVE

## 2022-05-06 LAB — GLUCOSE TOLERANCE, 2 HOURS W/ 1HR
Glucose, 1 hour: 120 mg/dL (ref 70–179)
Glucose, 2 hour: 102 mg/dL (ref 70–152)
Glucose, Fasting: 74 mg/dL (ref 70–91)

## 2022-05-06 LAB — HIV ANTIBODY (ROUTINE TESTING W REFLEX): HIV Screen 4th Generation wRfx: NONREACTIVE

## 2022-05-10 ENCOUNTER — Other Ambulatory Visit: Payer: Medicaid Other

## 2022-05-10 ENCOUNTER — Encounter: Payer: Medicaid Other | Admitting: Obstetrics & Gynecology

## 2022-05-24 ENCOUNTER — Encounter: Payer: Medicaid Other | Admitting: Obstetrics and Gynecology

## 2022-05-28 ENCOUNTER — Ambulatory Visit: Payer: Medicaid Other | Attending: Obstetrics and Gynecology

## 2022-05-28 ENCOUNTER — Ambulatory Visit: Payer: Medicaid Other | Admitting: *Deleted

## 2022-05-28 VITALS — BP 107/57 | HR 59

## 2022-05-28 DIAGNOSIS — O09523 Supervision of elderly multigravida, third trimester: Secondary | ICD-10-CM | POA: Insufficient documentation

## 2022-05-28 DIAGNOSIS — B1910 Unspecified viral hepatitis B without hepatic coma: Secondary | ICD-10-CM | POA: Diagnosis not present

## 2022-05-28 DIAGNOSIS — Z8759 Personal history of other complications of pregnancy, childbirth and the puerperium: Secondary | ICD-10-CM | POA: Insufficient documentation

## 2022-05-28 DIAGNOSIS — O98419 Viral hepatitis complicating pregnancy, unspecified trimester: Secondary | ICD-10-CM | POA: Diagnosis present

## 2022-05-28 DIAGNOSIS — O34219 Maternal care for unspecified type scar from previous cesarean delivery: Secondary | ICD-10-CM

## 2022-05-28 DIAGNOSIS — O0990 Supervision of high risk pregnancy, unspecified, unspecified trimester: Secondary | ICD-10-CM | POA: Insufficient documentation

## 2022-05-28 DIAGNOSIS — O09522 Supervision of elderly multigravida, second trimester: Secondary | ICD-10-CM | POA: Insufficient documentation

## 2022-05-28 DIAGNOSIS — Z3A32 32 weeks gestation of pregnancy: Secondary | ICD-10-CM

## 2022-05-28 DIAGNOSIS — O4443 Low lying placenta NOS or without hemorrhage, third trimester: Secondary | ICD-10-CM | POA: Diagnosis not present

## 2022-05-28 DIAGNOSIS — O98413 Viral hepatitis complicating pregnancy, third trimester: Secondary | ICD-10-CM

## 2022-05-28 DIAGNOSIS — O09293 Supervision of pregnancy with other poor reproductive or obstetric history, third trimester: Secondary | ICD-10-CM

## 2022-05-31 ENCOUNTER — Other Ambulatory Visit: Payer: Self-pay | Admitting: *Deleted

## 2022-05-31 DIAGNOSIS — O09523 Supervision of elderly multigravida, third trimester: Secondary | ICD-10-CM

## 2022-05-31 DIAGNOSIS — O98419 Viral hepatitis complicating pregnancy, unspecified trimester: Secondary | ICD-10-CM

## 2022-05-31 DIAGNOSIS — O99213 Obesity complicating pregnancy, third trimester: Secondary | ICD-10-CM

## 2022-06-02 ENCOUNTER — Other Ambulatory Visit: Payer: Self-pay | Admitting: Family Medicine

## 2022-06-02 ENCOUNTER — Ambulatory Visit (INDEPENDENT_AMBULATORY_CARE_PROVIDER_SITE_OTHER): Payer: Medicaid Other | Admitting: Obstetrics and Gynecology

## 2022-06-02 VITALS — BP 103/61 | HR 63 | Wt 186.5 lb

## 2022-06-02 DIAGNOSIS — O0993 Supervision of high risk pregnancy, unspecified, third trimester: Secondary | ICD-10-CM

## 2022-06-02 DIAGNOSIS — Z3A32 32 weeks gestation of pregnancy: Secondary | ICD-10-CM

## 2022-06-02 DIAGNOSIS — Z789 Other specified health status: Secondary | ICD-10-CM

## 2022-06-02 DIAGNOSIS — O0990 Supervision of high risk pregnancy, unspecified, unspecified trimester: Secondary | ICD-10-CM

## 2022-06-02 DIAGNOSIS — B181 Chronic viral hepatitis B without delta-agent: Secondary | ICD-10-CM

## 2022-06-02 DIAGNOSIS — O09523 Supervision of elderly multigravida, third trimester: Secondary | ICD-10-CM

## 2022-06-02 DIAGNOSIS — Z98891 History of uterine scar from previous surgery: Secondary | ICD-10-CM

## 2022-06-02 DIAGNOSIS — Z8759 Personal history of other complications of pregnancy, childbirth and the puerperium: Secondary | ICD-10-CM

## 2022-06-02 NOTE — Progress Notes (Signed)
   PRENATAL VISIT NOTE  Subjective:  Jeanne Williams is a 36 y.o. G3P2002 at [redacted]w[redacted]d being seen today for ongoing prenatal care.  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  Patient doing well with no acute concerns today. She reports no complaints.  Contractions: Not present. Vag. Bleeding: None.  Movement: Present. Denies leaking of fluid.   The following portions of the patient's history were reviewed and updated as appropriate: allergies, current medications, past family history, past medical history, past social history, past surgical history and problem list. Problem list updated.  Objective:   Vitals:   06/02/22 1130  BP: 103/61  Pulse: 63  Weight: 186 lb 8 oz (84.6 kg)    Fetal Status: Fetal Heart Rate (bpm): 142   Movement: Present     General:  Alert, oriented and cooperative. Patient is in no acute distress.  Skin: Skin is warm and dry. No rash noted.   Cardiovascular: Normal heart rate noted  Respiratory: Normal respiratory effort, no problems with respiration noted  Abdomen: Soft, gravid, appropriate for gestational age.  Pain/Pressure: Present     Pelvic: Cervical exam deferred        Extremities: Normal range of motion.  Edema: None  Mental Status:  Normal mood and affect. Normal behavior. Normal judgment and thought content.   Assessment and Plan:  Pregnancy: G3P2002 at [redacted]w[redacted]d  1. [redacted] weeks gestation of pregnancy   2. Chronic viral hepatitis B without delta agent and without coma (HCC) Last viral load is low Seen by ID 03/2022, not sure about continued follow up  3. Supervision of high risk pregnancy, antepartum Continue routine prenatal care  4. History of  C-section C/s x 2, pt will receive repeat c section with BTL  5. History of prior pregnancy with SGA newborn Last EFW WNL at 33%  6. Multigravida of advanced maternal age in third trimester   7. Language barrier Live interpreter present  Preterm labor symptoms and general obstetric precautions including but not limited to vaginal bleeding, contractions, leaking of fluid and fetal movement were reviewed in detail with the patient.  Please refer to After Visit Summary for other counseling recommendations.   Return in about 2 weeks (around 06/16/2022) for [facility], in person.   Mariel Aloe, MD Faculty Attending Center for [facility]

## 2022-06-16 ENCOUNTER — Ambulatory Visit (INDEPENDENT_AMBULATORY_CARE_PROVIDER_SITE_OTHER): Payer: Medicaid Other | Admitting: Obstetrics and Gynecology

## 2022-06-16 ENCOUNTER — Other Ambulatory Visit: Payer: Self-pay

## 2022-06-16 VITALS — BP 107/75 | HR 62 | Wt 188.3 lb

## 2022-06-16 DIAGNOSIS — Z3A34 34 weeks gestation of pregnancy: Secondary | ICD-10-CM

## 2022-06-16 NOTE — Progress Notes (Signed)
    PRENATAL VISIT NOTE  Subjective:  Jeanne Williams is a 36 y.o. G3P2002 at [redacted]w[redacted]d being seen today for ongoing prenatal care.  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  Patient reports no complaints.  Contractions: Irritability. Vag. Bleeding: None.  Movement: Present. Denies leaking of fluid.   The following portions of the patient's history were reviewed and updated as appropriate: allergies, current medications, past family history, past medical history, past social history, past surgical history and problem list.   Objective:   Vitals:   06/16/22 1422  BP: 107/75  Pulse: 62  Weight: 188 lb 4.8 oz (85.4 kg)    Fetal Status: Fetal Heart Rate (bpm): 157   Movement: Present     General:  Alert, oriented and cooperative. Patient is in no acute distress.  Skin: Skin is warm and dry. No rash noted.   Cardiovascular: Normal heart rate noted  Respiratory: Normal respiratory effort, no problems with respiration noted  Abdomen: Soft, gravid, appropriate for gestational age.  Pain/Pressure: Absent     Pelvic: Cervical exam deferred        Extremities: Normal range of motion.  Edema: Trace  Mental Status: Normal mood and affect. Normal behavior. Normal judgment and thought content.   Assessment and Plan:  Pregnancy: G3P2002 at [redacted]w[redacted]d 1. [redacted] weeks gestation of pregnancy BTL papers signed 7/12; still desires btl D/w her GBS nv  2. History of C-section Already scheduled   3. History of prior pregnancy with SGA newborn Normal growth scans. F/u rpt u/s with mfm 8/4: 33%, ac 22%, afi 14   4. Multigravida of advanced maternal age in third trimester   5. Chronic viral hepatitis B without  delta agent and without coma (HCC) Followed by ID. 6/28 labs in acceptable range. Plan for PP follow up with ID   6. Supervision of high risk pregnancy, antepartum   7. Language barrier Interpreter used  Preterm labor symptoms and general obstetric precautions including but not limited to vaginal bleeding, contractions, leaking of fluid and fetal movement were reviewed in detail with the patient. Please refer to After Visit Summary for other counseling recommendations.   Return in about 2 weeks (around 06/30/2022) for in person, md or app.  Future Appointments  Date Time Provider Department Center  06/25/2022  8:15 AM [facility] NURSE [facility]  06/25/2022  8:30 AM [facility] US3 [facility]  06/30/2022 10:35 AM Venora Maples, MD [facility]  07/07/2022 10:35 AM Milas Hock, MD [facility]  07/14/2022 10:35 AM Federico Flake, MD [facility]  07/21/2022  3:15 PM Venora Maples, MD [facility]  09/22/2022  4:15 PM Comer, Belia Heman, MD [facility]    [place] Bing, MD

## 2022-06-25 ENCOUNTER — Ambulatory Visit: Payer: Medicaid Other | Admitting: *Deleted

## 2022-06-25 ENCOUNTER — Ambulatory Visit (HOSPITAL_BASED_OUTPATIENT_CLINIC_OR_DEPARTMENT_OTHER): Payer: Medicaid Other | Admitting: *Deleted

## 2022-06-25 ENCOUNTER — Ambulatory Visit: Payer: Medicaid Other | Attending: Obstetrics

## 2022-06-25 VITALS — BP 100/67 | HR 63

## 2022-06-25 DIAGNOSIS — O09293 Supervision of pregnancy with other poor reproductive or obstetric history, third trimester: Secondary | ICD-10-CM

## 2022-06-25 DIAGNOSIS — O98419 Viral hepatitis complicating pregnancy, unspecified trimester: Secondary | ICD-10-CM | POA: Diagnosis present

## 2022-06-25 DIAGNOSIS — O09523 Supervision of elderly multigravida, third trimester: Secondary | ICD-10-CM

## 2022-06-25 DIAGNOSIS — O98413 Viral hepatitis complicating pregnancy, third trimester: Secondary | ICD-10-CM

## 2022-06-25 DIAGNOSIS — O34219 Maternal care for unspecified type scar from previous cesarean delivery: Secondary | ICD-10-CM

## 2022-06-25 DIAGNOSIS — O368390 Maternal care for abnormalities of the fetal heart rate or rhythm, unspecified trimester, not applicable or unspecified: Secondary | ICD-10-CM

## 2022-06-25 DIAGNOSIS — O0990 Supervision of high risk pregnancy, unspecified, unspecified trimester: Secondary | ICD-10-CM | POA: Insufficient documentation

## 2022-06-25 DIAGNOSIS — Z3A35 35 weeks gestation of pregnancy: Secondary | ICD-10-CM

## 2022-06-25 DIAGNOSIS — Z3A36 36 weeks gestation of pregnancy: Secondary | ICD-10-CM

## 2022-06-25 DIAGNOSIS — B1910 Unspecified viral hepatitis B without hepatic coma: Secondary | ICD-10-CM

## 2022-06-25 DIAGNOSIS — B181 Chronic viral hepatitis B without delta-agent: Secondary | ICD-10-CM

## 2022-06-25 DIAGNOSIS — O99213 Obesity complicating pregnancy, third trimester: Secondary | ICD-10-CM | POA: Insufficient documentation

## 2022-06-25 NOTE — Procedures (Signed)
Elyn Krogh 1986/04/04 [redacted]w[redacted]d  Fetus A Non-Stress Test Interpretation for 06/25/22  Indication:  fetal tachycardia  Fetal Heart Rate A Mode: External Baseline Rate (A): 155 bpm Variability: Moderate Accelerations: 15 x 15 Decelerations: Variable Multiple birth?: No  Uterine Activity Mode: Palpation, Toco Contraction Frequency (min): none Resting Tone Palpated: Relaxed  Interpretation (Fetal Testing) Nonstress Test Interpretation: Reactive Overall Impression: Reassuring for gestational age Comments: Dr. Judeth Cornfield reviewed tracing

## 2022-06-30 ENCOUNTER — Other Ambulatory Visit: Payer: Self-pay

## 2022-06-30 ENCOUNTER — Other Ambulatory Visit (HOSPITAL_COMMUNITY)
Admission: RE | Admit: 2022-06-30 | Discharge: 2022-06-30 | Disposition: A | Discharge status: Home / Self Care | Payer: Medicaid Other | Source: Ambulatory Visit | Attending: Family Medicine | Admitting: Family Medicine

## 2022-06-30 ENCOUNTER — Ambulatory Visit (INDEPENDENT_AMBULATORY_CARE_PROVIDER_SITE_OTHER): Payer: Medicaid Other | Admitting: Family Medicine

## 2022-06-30 VITALS — BP 108/75 | HR 83 | Wt 189.0 lb

## 2022-06-30 DIAGNOSIS — O09523 Supervision of elderly multigravida, third trimester: Secondary | ICD-10-CM

## 2022-06-30 DIAGNOSIS — O0990 Supervision of high risk pregnancy, unspecified, unspecified trimester: Secondary | ICD-10-CM | POA: Diagnosis not present

## 2022-06-30 DIAGNOSIS — Z789 Other specified health status: Secondary | ICD-10-CM

## 2022-06-30 DIAGNOSIS — B181 Chronic viral hepatitis B without delta-agent: Secondary | ICD-10-CM

## 2022-06-30 DIAGNOSIS — Z98891 History of uterine scar from previous surgery: Secondary | ICD-10-CM

## 2022-06-30 DIAGNOSIS — Z3A36 36 weeks gestation of pregnancy: Secondary | ICD-10-CM

## 2022-06-30 DIAGNOSIS — O0993 Supervision of high risk pregnancy, unspecified, third trimester: Secondary | ICD-10-CM

## 2022-06-30 NOTE — Progress Notes (Signed)
   Subjective:  Jeanne Williams is a 36 y.o. G3P2002 at [redacted]w[redacted]d being seen today for ongoing prenatal care.  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  Patient reports no complaints.  Contractions: Not present. Vag. Bleeding: None.  Movement: Present. Denies leaking of fluid.   The following portions of the patient's history were reviewed and updated as appropriate: allergies, current medications, past family history, past medical history, past social history, past surgical history and problem list. Problem list updated.  Objective:   Vitals:   06/30/22 1105  BP: 108/75  Pulse: 83  Weight: 189 lb (85.7 kg)    Fetal Status: Fetal Heart Rate (bpm): 167   Movement: Present     General:  Alert, oriented and cooperative. Patient is in no acute distress.  Skin: Skin is warm and dry. No rash noted.   Cardiovascular: Normal heart rate noted  Respiratory: Normal respiratory effort, no problems with respiration noted  Abdomen: Soft, gravid, appropriate for gestational age. Pain/Pressure: Absent     Pelvic: Vag. Bleeding: None     Cervical exam deferred        Extremities: Normal range of motion.     Mental Status: Normal mood and affect. Normal behavior. Normal judgment and thought content.   Urinalysis:      Assessment and Plan:  Pregnancy: G3P2002 at [redacted]w[redacted]d  1. Supervision of high risk pregnancy, antepartum BP and FHR normal Swabs collected today - Cervicovaginal ancillary only( [place]) - Culture, beta strep (group b only)  2. Language barrier [place] video interpreter used throughout visit, Aline #[id number]  3. History of C-section X2, scheduled for RCS+BTL  4. Multigravida of  advanced maternal age in third trimester   5. Chronic viral hepatitis B without delta agent and without coma (HCC) Following w ID Postpartum visit planned for follow up  Preterm labor symptoms and general obstetric precautions including but not limited to vaginal bleeding, contractions, leaking of fluid and fetal movement were reviewed in detail with the patient. Please refer to After Visit Summary for other counseling recommendations.  Return in 1 week (on 07/07/2022) for [facility], ob visit.   Venora Maples, MD

## 2022-06-30 NOTE — Patient Instructions (Signed)

## 2022-07-01 LAB — CERVICOVAGINAL ANCILLARY ONLY
Chlamydia: NEGATIVE
Comment: NEGATIVE
Comment: NORMAL
Neisseria Gonorrhea: NEGATIVE

## 2022-07-02 ENCOUNTER — Other Ambulatory Visit: Payer: Self-pay | Admitting: Family Medicine

## 2022-07-02 DIAGNOSIS — R12 Heartburn: Secondary | ICD-10-CM

## 2022-07-03 LAB — CULTURE, BETA STREP (GROUP B ONLY): Strep Gp B Culture: POSITIVE — AB

## 2022-07-04 NOTE — Progress Notes (Unsigned)
   PRENATAL VISIT NOTE  Subjective:  Jeanne Williams is a 36 y.o. G3P2002 at 38w***d being seen today for ongoing prenatal care.  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  Patient reports {[id number]}.   .  .   . Denies leaking of fluid.   The following portions of the patient's history were reviewed and updated as appropriate: allergies, current medications, past family history, past medical history, past social history, past surgical history and problem list.   Objective:  There were no vitals filed for this visit.  Fetal Status:           General:  Alert, oriented and cooperative. Patient is in no acute distress.  Skin: Skin is warm and dry. No rash noted.   Cardiovascular: Normal heart rate noted  Respiratory: Normal respiratory effort, no problems with respiration noted  Abdomen: Soft, gravid, appropriate for gestational age.        Pelvic: Cervical exam deferred        Extremities: Normal range of motion.     Mental Status: Normal mood and affect. Normal behavior. Normal judgment and thought content.   Assessment and Plan:  Pregnancy: G3P2002 at 38w***d 1. Supervision of high risk pregnancy, antepartum Offered and recommended flu shot - pt *** GBS pos  2. History of C-section Has repeat scheduled for 9/21. Desires BTL and papers signed on 7/12.   3. History of prior pregnancy with SGA newborn Normal growth this pregnancy.  4. Multigravida of advanced maternal age in third trimester LR NIPS and normal [place]  5. Language barrier [place] - ***  6. Chronic viral hepatitis B without delta agent and without coma (HCC) Follows with ID and LFTs have been normal.  HBIG after  delivery  Term labor symptoms and general obstetric precautions including but not limited to vaginal bleeding, contractions, leaking of fluid and fetal movement were reviewed in detail with the patient. Please refer to After Visit Summary for other counseling recommendations.   No follow-ups on file.  Future Appointments  Date Time Provider Department Center  07/07/2022 10:35 AM Milas Hock, MD [facility]  07/14/2022 10:35 AM Federico Flake, MD [facility]  07/21/2022  3:15 PM Venora Maples, MD [facility]  09/22/2022  4:15 PM Comer, Belia Heman, MD [facility]    Milas Hock, MD

## 2022-07-05 NOTE — Patient Instructions (Signed)
Jeanne Williams  07/05/2022   Your procedure is scheduled on:  07/15/2022  Arrive at 0730 at Entrance C on [address] at [facility]  and [address]. You are invited to use the FREE valet parking or use the Visitor's parking deck.  Pick up the phone at the desk and dial [phone].  Call this number if you have problems the morning of surgery: [phone]  Remember:   Do not eat food:(After Midnight) Desps de medianoche.  Do not drink clear liquids: (After Midnight) Desps de medianoche.  Take these medicines the morning of surgery with A SIP OF WATER:  none   Do not wear jewelry, make-up or nail polish.  Do not wear lotions, powders, or perfumes. Do not wear deodorant.  Do not shave 48 hours prior to surgery.  Do not bring valuables to the hospital.  [facility] is not   responsible for any belongings or valuables brought to the hospital.  Contacts, dentures or bridgework may not be worn into surgery.  Leave suitcase in the car. After surgery it may be brought to your room.  For patients admitted to the hospital, checkout time is 11:00 AM the day of              discharge.      Please read over the following fact sheets that you were given:     Preparing for Surgery

## 2022-07-06 ENCOUNTER — Encounter (HOSPITAL_COMMUNITY): Payer: Self-pay

## 2022-07-06 ENCOUNTER — Telehealth (HOSPITAL_COMMUNITY): Payer: Self-pay | Admitting: *Deleted

## 2022-07-06 NOTE — Pre-Procedure Instructions (Signed)
Interpreter number [phone]

## 2022-07-06 NOTE — Telephone Encounter (Signed)
Preadmission screen  

## 2022-07-07 ENCOUNTER — Encounter: Payer: Self-pay | Admitting: Obstetrics and Gynecology

## 2022-07-07 ENCOUNTER — Encounter (HOSPITAL_COMMUNITY): Payer: Self-pay

## 2022-07-07 ENCOUNTER — Ambulatory Visit (INDEPENDENT_AMBULATORY_CARE_PROVIDER_SITE_OTHER): Payer: Medicaid Other | Admitting: Obstetrics and Gynecology

## 2022-07-07 ENCOUNTER — Other Ambulatory Visit: Payer: Self-pay

## 2022-07-07 VITALS — BP 123/80 | HR 81 | Wt 189.7 lb

## 2022-07-07 DIAGNOSIS — Z23 Encounter for immunization: Secondary | ICD-10-CM

## 2022-07-07 DIAGNOSIS — O0993 Supervision of high risk pregnancy, unspecified, third trimester: Secondary | ICD-10-CM | POA: Diagnosis not present

## 2022-07-07 DIAGNOSIS — O09523 Supervision of elderly multigravida, third trimester: Secondary | ICD-10-CM

## 2022-07-07 DIAGNOSIS — B181 Chronic viral hepatitis B without delta-agent: Secondary | ICD-10-CM

## 2022-07-07 DIAGNOSIS — O0990 Supervision of high risk pregnancy, unspecified, unspecified trimester: Secondary | ICD-10-CM

## 2022-07-07 DIAGNOSIS — Z8759 Personal history of other complications of pregnancy, childbirth and the puerperium: Secondary | ICD-10-CM

## 2022-07-07 DIAGNOSIS — R12 Heartburn: Secondary | ICD-10-CM

## 2022-07-07 DIAGNOSIS — Z3A37 37 weeks gestation of pregnancy: Secondary | ICD-10-CM

## 2022-07-07 DIAGNOSIS — Z789 Other specified health status: Secondary | ICD-10-CM

## 2022-07-07 DIAGNOSIS — Z98891 History of uterine scar from previous surgery: Secondary | ICD-10-CM

## 2022-07-07 DIAGNOSIS — O26893 Other specified pregnancy related conditions, third trimester: Secondary | ICD-10-CM

## 2022-07-07 MED ORDER — OMEPRAZOLE 20 MG PO CPDR: 20.0000 mg | DELAYED_RELEASE_CAPSULE | Freq: Every day | ORAL | 1 refills | Status: DC

## 2022-07-07 NOTE — Pre-Procedure Instructions (Signed)
Preadmission screen interpreter number [phone]

## 2022-07-13 ENCOUNTER — Encounter (HOSPITAL_COMMUNITY)
Admission: RE | Admit: 2022-07-13 | Discharge: 2022-07-13 | Disposition: A | Discharge status: Home / Self Care | Payer: Medicaid Other | Source: Ambulatory Visit | Attending: Obstetrics & Gynecology | Admitting: Obstetrics & Gynecology

## 2022-07-13 NOTE — Patient Instructions (Signed)
Jeanne Williams  07/13/2022   Your procedure is scheduled on:  07/15/2022  Arrive at 72 at Entrance C on [address] at [facility]  and [address]. You are invited to use the FREE valet parking or use the Visitor's parking deck.  Pick up the phone at the desk and dial [phone].  Call this number if you have problems the morning of surgery: [phone]  Remember:   Do not eat food:(After Midnight) Desps de medianoche.  Do not drink clear liquids: (After Midnight) Desps de medianoche.  Take these medicines the morning of surgery with A SIP OF WATER:  none   Do not wear jewelry, make-up or nail polish.  Do not wear lotions, powders, or perfumes. Do not wear deodorant.  Do not shave 48 hours prior to surgery.  Do not bring valuables to the hospital.  [facility] is not   responsible for any belongings or valuables brought to the hospital.  Contacts, dentures or bridgework may not be worn into surgery.  Leave suitcase in the car. After surgery it may be brought to your room.  For patients admitted to the hospital, checkout time is 11:00 AM the day of              discharge.      Please read over the following fact sheets that you were given:     Preparing for Surgery

## 2022-07-14 ENCOUNTER — Other Ambulatory Visit (HOSPITAL_COMMUNITY)
Admission: RE | Admit: 2022-07-14 | Discharge: 2022-07-14 | Disposition: A | Discharge status: Home / Self Care | Payer: Medicaid Other | Source: Ambulatory Visit | Attending: Obstetrics & Gynecology | Admitting: Obstetrics & Gynecology

## 2022-07-14 ENCOUNTER — Encounter: Payer: Medicaid Other | Admitting: Family Medicine

## 2022-07-14 DIAGNOSIS — Z98891 History of uterine scar from previous surgery: Secondary | ICD-10-CM

## 2022-07-14 DIAGNOSIS — O09523 Supervision of elderly multigravida, third trimester: Secondary | ICD-10-CM

## 2022-07-14 DIAGNOSIS — B181 Chronic viral hepatitis B without delta-agent: Secondary | ICD-10-CM

## 2022-07-14 DIAGNOSIS — O0990 Supervision of high risk pregnancy, unspecified, unspecified trimester: Secondary | ICD-10-CM | POA: Insufficient documentation

## 2022-07-14 LAB — COMPREHENSIVE METABOLIC PANEL
ALT: 15 U/L (ref 0–44)
AST: 23 U/L (ref 15–41)
Albumin: 2.7 g/dL — ABNORMAL LOW (ref 3.5–5.0)
Alkaline Phosphatase: 123 U/L (ref 38–126)
Anion gap: 6 (ref 5–15)
BUN: <5 mg/dL — ABNORMAL LOW (ref 6–20)
CO2: 20 mmol/L — ABNORMAL LOW (ref 22–32)
Calcium: 8.7 mg/dL — ABNORMAL LOW (ref 8.9–10.3)
Chloride: 110 mmol/L (ref 98–111)
Creatinine, Ser: 0.58 mg/dL (ref 0.44–1.00)
GFR, Estimated: >60 mL/min (ref >60)
Glucose, Bld: 95 mg/dL (ref 70–99)
Potassium: 3.5 mmol/L (ref 3.5–5.1)
Sodium: 136 mmol/L (ref 135–145)
Total Bilirubin: 0.6 mg/dL (ref 0.3–1.2)
Total Protein: 6.3 g/dL — ABNORMAL LOW (ref 6.5–8.1)

## 2022-07-14 LAB — CBC
HCT: 34.6 % — ABNORMAL LOW (ref 36.0–46.0)
Hemoglobin: 11.5 g/dL — ABNORMAL LOW (ref 12.0–15.0)
MCH: 33.2 pg (ref 26.0–34.0)
MCHC: 33.2 g/dL (ref 30.0–36.0)
MCV: 100 fL (ref 80.0–100.0)
Platelets: 154 10*3/uL (ref 150–400)
RBC: 3.46 MIL/uL — ABNORMAL LOW (ref 3.87–5.11)
RDW: 13.8 % (ref 11.5–15.5)
WBC: 5.6 10*3/uL (ref 4.0–10.5)
nRBC: 0 % (ref 0.0–0.2)

## 2022-07-14 LAB — RAPID HIV SCREEN (HIV 1/2 AB+AG)
HIV 1/2 Antibodies: NONREACTIVE
HIV-1 P24 Antigen - HIV24: NONREACTIVE

## 2022-07-14 LAB — TYPE AND SCREEN
ABO/RH(D): B POS
Antibody Screen: NEGATIVE

## 2022-07-14 NOTE — Anesthesia Preprocedure Evaluation (Signed)
Anesthesia Evaluation  Patient identified by MRN, date of birth, ID band Patient awake    Reviewed: Allergy & Precautions, NPO status , Patient's Chart, lab work & pertinent test results  History of Anesthesia Complications Negative for: history of anesthetic complications  Airway Mallampati: III  TM Distance: >3 FB Neck ROM: Full    Dental  (+) Dental Advisory Given   Pulmonary neg pulmonary ROS,    Pulmonary exam normal        Cardiovascular negative cardio ROS Normal cardiovascular exam     Neuro/Psych negative neurological ROS  negative psych ROS   GI/Hepatic GERD  Medicated and Controlled,(+) Hepatitis -, B  Endo/Other   Obesity   Renal/GU negative Renal ROS     Musculoskeletal negative musculoskeletal ROS (+)   Abdominal   Peds  Hematology  (+) Blood dyscrasia, anemia ,   Anesthesia Other Findings   Reproductive/Obstetrics (+) Pregnancy                            Anesthesia Physical Anesthesia Plan  ASA: 2  Anesthesia Plan: Spinal   Post-op Pain Management: Tylenol PO (pre-op)* and Regional block*   Induction:   PONV Risk Score and Plan: 2 and Treatment may vary due to age or medical condition, Ondansetron and Scopolamine patch - Pre-op  Airway Management Planned: Natural Airway  Additional Equipment: None  Intra-op Plan:   Post-operative Plan:   Informed Consent: I have reviewed the patients History and Physical, chart, labs and discussed the procedure including the risks, benefits and alternatives for the proposed anesthesia with the patient or authorized representative who has indicated his/her understanding and acceptance.     Interpreter used for [employer] Discussed with: CRNA and Anesthesiologist  Anesthesia Plan Comments: (Labs reviewed, platelets acceptable. Discussed risks and benefits of spinal, including spinal/epidural hematoma, infection,  failed block, and PDPH. Patient expressed understanding and wished to proceed. )       Anesthesia Quick Evaluation

## 2022-07-15 ENCOUNTER — Inpatient Hospital Stay (HOSPITAL_COMMUNITY): Payer: Medicaid Other | Admitting: Anesthesiology

## 2022-07-15 ENCOUNTER — Encounter (HOSPITAL_COMMUNITY)
Admission: RE | Disposition: A | Discharge status: Home / Self Care | Payer: Self-pay | Source: Home / Self Care | Attending: Family Medicine

## 2022-07-15 ENCOUNTER — Other Ambulatory Visit: Payer: Self-pay

## 2022-07-15 ENCOUNTER — Encounter (HOSPITAL_COMMUNITY): Payer: Self-pay | Admitting: Family Medicine

## 2022-07-15 ENCOUNTER — Inpatient Hospital Stay (HOSPITAL_COMMUNITY)
Admission: RE | Admit: 2022-07-15 | Discharge: 2022-07-17 | DRG: 784 | Disposition: A | Discharge status: Home / Self Care | Payer: Medicaid Other | Attending: Family Medicine | Admitting: Family Medicine

## 2022-07-15 DIAGNOSIS — Z302 Encounter for sterilization: Secondary | ICD-10-CM

## 2022-07-15 DIAGNOSIS — O99824 Streptococcus B carrier state complicating childbirth: Secondary | ICD-10-CM | POA: Diagnosis present

## 2022-07-15 DIAGNOSIS — O09523 Supervision of elderly multigravida, third trimester: Secondary | ICD-10-CM

## 2022-07-15 DIAGNOSIS — O9081 Anemia of the puerperium: Secondary | ICD-10-CM | POA: Diagnosis not present

## 2022-07-15 DIAGNOSIS — O99214 Obesity complicating childbirth: Secondary | ICD-10-CM | POA: Diagnosis present

## 2022-07-15 DIAGNOSIS — D62 Acute posthemorrhagic anemia: Secondary | ICD-10-CM | POA: Diagnosis not present

## 2022-07-15 DIAGNOSIS — O34211 Maternal care for low transverse scar from previous cesarean delivery: Principal | ICD-10-CM | POA: Diagnosis present

## 2022-07-15 DIAGNOSIS — O9842 Viral hepatitis complicating childbirth: Secondary | ICD-10-CM | POA: Diagnosis present

## 2022-07-15 DIAGNOSIS — Z9079 Acquired absence of other genital organ(s): Secondary | ICD-10-CM

## 2022-07-15 DIAGNOSIS — O9902 Anemia complicating childbirth: Secondary | ICD-10-CM | POA: Diagnosis not present

## 2022-07-15 DIAGNOSIS — Z3A39 39 weeks gestation of pregnancy: Secondary | ICD-10-CM | POA: Diagnosis not present

## 2022-07-15 DIAGNOSIS — B181 Chronic viral hepatitis B without delta-agent: Secondary | ICD-10-CM

## 2022-07-15 DIAGNOSIS — Z98891 History of uterine scar from previous surgery: Secondary | ICD-10-CM

## 2022-07-15 DIAGNOSIS — O0990 Supervision of high risk pregnancy, unspecified, unspecified trimester: Principal | ICD-10-CM

## 2022-07-15 LAB — ABO/RH: ABO/RH(D): B POS

## 2022-07-15 LAB — RPR: RPR Ser Ql: NONREACTIVE

## 2022-07-15 SURGERY — Surgical Case
Anesthesia: Spinal | Laterality: Bilateral

## 2022-07-15 MED ORDER — PHENYLEPHRINE HCL-NACL 20-0.9 MG/250ML-% IV SOLN
INTRAVENOUS | Status: AC
  Filled 2022-07-15: qty 250

## 2022-07-15 MED ORDER — LACTATED RINGERS IV SOLN
INTRAVENOUS | Status: DC
  Administered 2022-07-15: 1000 mL via INTRAVENOUS

## 2022-07-15 MED ORDER — PHENYLEPHRINE HCL-NACL 20-0.9 MG/250ML-% IV SOLN
INTRAVENOUS | Status: DC | PRN
  Administered 2022-07-15: 60 ug/min via INTRAVENOUS

## 2022-07-15 MED ORDER — ACETAMINOPHEN 500 MG PO TABS
1000.0000 mg | ORAL_TABLET | Freq: Once | ORAL | Status: AC
  Administered 2022-07-15: 1000 mg via ORAL

## 2022-07-15 MED ORDER — NALOXONE HCL 0.4 MG/ML IJ SOLN: 0.4000 mg | INTRAMUSCULAR | Status: DC | PRN

## 2022-07-15 MED ORDER — SODIUM CHLORIDE 0.9 % IR SOLN
Status: DC | PRN
  Administered 2022-07-15: 1

## 2022-07-15 MED ORDER — SODIUM CHLORIDE 0.9% FLUSH: 3.0000 mL | INTRAVENOUS | Status: DC | PRN

## 2022-07-15 MED ORDER — MORPHINE SULFATE (PF) 0.5 MG/ML IJ SOLN
INTRAMUSCULAR | Status: DC | PRN
  Administered 2022-07-15: 150 ug via INTRATHECAL

## 2022-07-15 MED ORDER — DEXAMETHASONE SODIUM PHOSPHATE 4 MG/ML IJ SOLN
INTRAMUSCULAR | Status: DC | PRN
  Administered 2022-07-15: 4 mg via INTRAVENOUS

## 2022-07-15 MED ORDER — SCOPOLAMINE 1 MG/3DAYS TD PT72
MEDICATED_PATCH | TRANSDERMAL | Status: AC
  Filled 2022-07-15: qty 1

## 2022-07-15 MED ORDER — COCONUT OIL OIL: 1.0000 | TOPICAL_OIL | Status: DC | PRN

## 2022-07-15 MED ORDER — NALOXONE HCL 4 MG/10ML IJ SOLN: 1.0000 ug/kg/h | INTRAVENOUS | Status: DC | PRN

## 2022-07-15 MED ORDER — ONDANSETRON HCL 4 MG/2ML IJ SOLN: 4.0000 mg | Freq: Three times a day (TID) | INTRAMUSCULAR | Status: DC | PRN

## 2022-07-15 MED ORDER — DIPHENHYDRAMINE HCL 50 MG/ML IJ SOLN: 12.5000 mg | INTRAMUSCULAR | Status: DC | PRN

## 2022-07-15 MED ORDER — SIMETHICONE 80 MG PO CHEW
80.0000 mg | CHEWABLE_TABLET | Freq: Three times a day (TID) | ORAL | Status: DC
  Administered 2022-07-15 – 2022-07-17 (×5): 80 mg via ORAL
  Filled 2022-07-15 (×5): qty 1

## 2022-07-15 MED ORDER — MENTHOL 3 MG MT LOZG: 1.0000 | LOZENGE | OROMUCOSAL | Status: DC | PRN

## 2022-07-15 MED ORDER — STERILE WATER FOR IRRIGATION IR SOLN
Status: DC | PRN
  Administered 2022-07-15: 1000 mL

## 2022-07-15 MED ORDER — PROMETHAZINE HCL 25 MG/ML IJ SOLN: 6.2500 mg | INTRAMUSCULAR | Status: DC | PRN

## 2022-07-15 MED ORDER — MEPERIDINE HCL 25 MG/ML IJ SOLN: 6.2500 mg | INTRAMUSCULAR | Status: DC | PRN

## 2022-07-15 MED ORDER — WITCH HAZEL-GLYCERIN EX PADS: 1.0000 | MEDICATED_PAD | CUTANEOUS | Status: DC | PRN

## 2022-07-15 MED ORDER — SCOPOLAMINE 1 MG/3DAYS TD PT72
1.0000 | MEDICATED_PATCH | Freq: Once | TRANSDERMAL | Status: DC
  Administered 2022-07-15: 1.5 mg via TRANSDERMAL

## 2022-07-15 MED ORDER — SOD CITRATE-CITRIC ACID 500-334 MG/5ML PO SOLN
ORAL | Status: AC
  Filled 2022-07-15: qty 30

## 2022-07-15 MED ORDER — FENTANYL CITRATE (PF) 100 MCG/2ML IJ SOLN
INTRAMUSCULAR | Status: DC | PRN
  Administered 2022-07-15: 15 ug via INTRATHECAL

## 2022-07-15 MED ORDER — OXYTOCIN-SODIUM CHLORIDE 30-0.9 UT/500ML-% IV SOLN: 2.5000 [IU]/h | INTRAVENOUS | Status: AC

## 2022-07-15 MED ORDER — ORAL CARE MOUTH RINSE: 15.0000 mL | Freq: Once | OROMUCOSAL | Status: AC

## 2022-07-15 MED ORDER — CEFAZOLIN SODIUM-DEXTROSE 2-4 GM/100ML-% IV SOLN
INTRAVENOUS | Status: AC
  Filled 2022-07-15: qty 100

## 2022-07-15 MED ORDER — DIPHENHYDRAMINE HCL 25 MG PO CAPS: 25.0000 mg | ORAL_CAPSULE | Freq: Four times a day (QID) | ORAL | Status: DC | PRN

## 2022-07-15 MED ORDER — ZOLPIDEM TARTRATE 5 MG PO TABS: 5.0000 mg | ORAL_TABLET | Freq: Every evening | ORAL | Status: DC | PRN

## 2022-07-15 MED ORDER — SODIUM CHLORIDE 0.9 % IV SOLN
12.5000 mg | Freq: Four times a day (QID) | INTRAVENOUS | Status: DC | PRN
  Administered 2022-07-15: 12.5 mg via INTRAVENOUS
  Filled 2022-07-15: qty 12.5

## 2022-07-15 MED ORDER — LACTATED RINGERS IV SOLN: INTRAVENOUS | Status: DC

## 2022-07-15 MED ORDER — ENOXAPARIN SODIUM 40 MG/0.4ML IJ SOSY
40.0000 mg | PREFILLED_SYRINGE | INTRAMUSCULAR | Status: DC
  Administered 2022-07-16: 40 mg via SUBCUTANEOUS
  Filled 2022-07-15 (×2): qty 0.4

## 2022-07-15 MED ORDER — SENNOSIDES-DOCUSATE SODIUM 8.6-50 MG PO TABS
2.0000 | ORAL_TABLET | Freq: Every day | ORAL | Status: DC
  Administered 2022-07-16 – 2022-07-17 (×2): 2 via ORAL
  Filled 2022-07-15 (×2): qty 2

## 2022-07-15 MED ORDER — OXYCODONE HCL 5 MG PO TABS
5.0000 mg | ORAL_TABLET | ORAL | Status: DC | PRN
  Administered 2022-07-17: 10 mg via ORAL
  Filled 2022-07-15: qty 2

## 2022-07-15 MED ORDER — OXYCODONE HCL 5 MG PO TABS: 5.0000 mg | ORAL_TABLET | Freq: Once | ORAL | Status: DC | PRN

## 2022-07-15 MED ORDER — KETOROLAC TROMETHAMINE 30 MG/ML IJ SOLN
INTRAMUSCULAR | Status: DC | PRN
  Administered 2022-07-15: 30 mg via INTRAVENOUS

## 2022-07-15 MED ORDER — ACETAMINOPHEN 500 MG PO TABS
ORAL_TABLET | ORAL | Status: AC
  Filled 2022-07-15: qty 2

## 2022-07-15 MED ORDER — OXYCODONE HCL 5 MG/5ML PO SOLN: 5.0000 mg | Freq: Once | ORAL | Status: DC | PRN

## 2022-07-15 MED ORDER — ONDANSETRON HCL 4 MG/2ML IJ SOLN
INTRAMUSCULAR | Status: DC | PRN
  Administered 2022-07-15: 4 mg via INTRAVENOUS

## 2022-07-15 MED ORDER — ACETAMINOPHEN 160 MG/5ML PO SOLN: 960.0000 mg | Freq: Once | ORAL | Status: AC

## 2022-07-15 MED ORDER — GABAPENTIN 100 MG PO CAPS
200.0000 mg | ORAL_CAPSULE | Freq: Every day | ORAL | Status: DC
  Administered 2022-07-15 – 2022-07-16 (×2): 200 mg via ORAL
  Filled 2022-07-15 (×2): qty 2

## 2022-07-15 MED ORDER — DIBUCAINE (PERIANAL) 1 % EX OINT: 1.0000 | TOPICAL_OINTMENT | CUTANEOUS | Status: DC | PRN

## 2022-07-15 MED ORDER — POVIDONE-IODINE 10 % EX SWAB: 2.0000 | Freq: Once | CUTANEOUS | Status: DC

## 2022-07-15 MED ORDER — IBUPROFEN 600 MG PO TABS
600.0000 mg | ORAL_TABLET | Freq: Four times a day (QID) | ORAL | Status: DC
  Administered 2022-07-16 – 2022-07-17 (×3): 600 mg via ORAL
  Filled 2022-07-15 (×3): qty 1

## 2022-07-15 MED ORDER — KETOROLAC TROMETHAMINE 30 MG/ML IJ SOLN: 30.0000 mg | Freq: Four times a day (QID) | INTRAMUSCULAR | Status: AC | PRN

## 2022-07-15 MED ORDER — FENTANYL CITRATE (PF) 100 MCG/2ML IJ SOLN: 25.0000 ug | INTRAMUSCULAR | Status: DC | PRN

## 2022-07-15 MED ORDER — OXYTOCIN-SODIUM CHLORIDE 30-0.9 UT/500ML-% IV SOLN
INTRAVENOUS | Status: AC
  Filled 2022-07-15: qty 500

## 2022-07-15 MED ORDER — SIMETHICONE 80 MG PO CHEW: 80.0000 mg | CHEWABLE_TABLET | ORAL | Status: DC | PRN

## 2022-07-15 MED ORDER — OXYTOCIN-SODIUM CHLORIDE 30-0.9 UT/500ML-% IV SOLN
INTRAVENOUS | Status: DC | PRN
  Administered 2022-07-15: 300 mL via INTRAVENOUS

## 2022-07-15 MED ORDER — DIPHENHYDRAMINE HCL 25 MG PO CAPS: 25.0000 mg | ORAL_CAPSULE | ORAL | Status: DC | PRN

## 2022-07-15 MED ORDER — CHLORHEXIDINE GLUCONATE 0.12 % MT SOLN
OROMUCOSAL | Status: AC
  Filled 2022-07-15: qty 15

## 2022-07-15 MED ORDER — DEXAMETHASONE SODIUM PHOSPHATE 4 MG/ML IJ SOLN
INTRAMUSCULAR | Status: AC
  Filled 2022-07-15: qty 1

## 2022-07-15 MED ORDER — SCOPOLAMINE 1 MG/3DAYS TD PT72: 1.0000 | MEDICATED_PATCH | Freq: Once | TRANSDERMAL | Status: DC

## 2022-07-15 MED ORDER — MORPHINE SULFATE (PF) 0.5 MG/ML IJ SOLN
INTRAMUSCULAR | Status: AC
  Filled 2022-07-15: qty 10

## 2022-07-15 MED ORDER — SOD CITRATE-CITRIC ACID 500-334 MG/5ML PO SOLN
30.0000 mL | Freq: Once | ORAL | Status: AC
  Administered 2022-07-15: 30 mL via ORAL

## 2022-07-15 MED ORDER — KETOROLAC TROMETHAMINE 30 MG/ML IJ SOLN
30.0000 mg | Freq: Four times a day (QID) | INTRAMUSCULAR | Status: AC
  Administered 2022-07-15 – 2022-07-16 (×4): 30 mg via INTRAVENOUS
  Filled 2022-07-15 (×4): qty 1

## 2022-07-15 MED ORDER — PRENATAL MULTIVITAMIN CH
1.0000 | ORAL_TABLET | Freq: Every day | ORAL | Status: DC
  Administered 2022-07-16 – 2022-07-17 (×2): 1 via ORAL
  Filled 2022-07-15 (×2): qty 1

## 2022-07-15 MED ORDER — ONDANSETRON HCL 4 MG/2ML IJ SOLN
INTRAMUSCULAR | Status: AC
  Filled 2022-07-15: qty 2

## 2022-07-15 MED ORDER — FENTANYL CITRATE (PF) 100 MCG/2ML IJ SOLN
INTRAMUSCULAR | Status: AC
  Filled 2022-07-15: qty 2

## 2022-07-15 MED ORDER — MAGNESIUM HYDROXIDE 400 MG/5ML PO SUSP: 30.0000 mL | ORAL | Status: DC | PRN

## 2022-07-15 MED ORDER — FAMOTIDINE 20 MG PO TABS
ORAL_TABLET | ORAL | Status: AC
  Filled 2022-07-15: qty 1

## 2022-07-15 MED ORDER — ACETAMINOPHEN 500 MG PO TABS
1000.0000 mg | ORAL_TABLET | Freq: Four times a day (QID) | ORAL | Status: DC
  Administered 2022-07-15 – 2022-07-17 (×8): 1000 mg via ORAL
  Filled 2022-07-15 (×9): qty 2

## 2022-07-15 MED ORDER — CEFAZOLIN SODIUM-DEXTROSE 2-4 GM/100ML-% IV SOLN
2.0000 g | INTRAVENOUS | Status: AC
  Administered 2022-07-15: 2 g via INTRAVENOUS

## 2022-07-15 MED ORDER — CHLORHEXIDINE GLUCONATE 0.12 % MT SOLN
15.0000 mL | Freq: Once | OROMUCOSAL | Status: AC
  Administered 2022-07-15: 15 mL via OROMUCOSAL

## 2022-07-15 MED ORDER — FAMOTIDINE 20 MG PO TABS
20.0000 mg | ORAL_TABLET | Freq: Once | ORAL | Status: AC
  Administered 2022-07-15: 20 mg via ORAL

## 2022-07-15 SURGICAL SUPPLY — 29 items
BENZOIN TINCTURE PRP APPL 2/3 (GAUZE/BANDAGES/DRESSINGS) ×1 IMPLANT
CANISTER SUCT 3000ML PPV (MISCELLANEOUS) ×1 IMPLANT
CHLORAPREP W/TINT 26ML (MISCELLANEOUS) ×2 IMPLANT
CLAMP UMBILICAL CORD (MISCELLANEOUS) ×1 IMPLANT
CLIP FILSHIE TUBAL LIGA STRL (Clip) ×1 IMPLANT
CLOTH BEACON ORANGE TIMEOUT ST (SAFETY) ×1 IMPLANT
DRAPE SHEET LG 3/4 BI-LAMINATE (DRAPES) ×1 IMPLANT
DRSG OPSITE POSTOP 4X10 (GAUZE/BANDAGES/DRESSINGS) ×1 IMPLANT
ELECT REM PT RETURN 9FT ADLT (ELECTROSURGICAL) ×1
ELECTRODE REM PT RTRN 9FT ADLT (ELECTROSURGICAL) ×1 IMPLANT
GLOVE BIOGEL PI IND STRL 7.0 (GLOVE) ×3 IMPLANT
GLOVE ECLIPSE 6.5 STRL STRAW (GLOVE) ×1 IMPLANT
GOWN STRL REUS W/ TWL LRG LVL3 (GOWN DISPOSABLE) ×2 IMPLANT
GOWN STRL REUS W/TWL LRG LVL3 (GOWN DISPOSABLE) ×2
NS IRRIG 1000ML POUR BTL (IV SOLUTION) ×1 IMPLANT
PAD OB MATERNITY 4.3X12.25 (PERSONAL CARE ITEMS) ×1 IMPLANT
PAD PREP 24X48 CUFFED NSTRL (MISCELLANEOUS) ×1 IMPLANT
RETRACTOR WND ALEXIS 25 LRG (MISCELLANEOUS) IMPLANT
RTRCTR WOUND ALEXIS 25CM LRG (MISCELLANEOUS)
STRIP CLOSURE SKIN 1/2X4 (GAUZE/BANDAGES/DRESSINGS) ×1 IMPLANT
SUT PLAIN 0 NONE (SUTURE) IMPLANT
SUT PLAIN 2 0 XLH (SUTURE) ×1 IMPLANT
SUT VIC AB 0 CT1 36 (SUTURE) ×2 IMPLANT
SUT VIC AB 2-0 CT1 27 (SUTURE) ×1
SUT VIC AB 2-0 CT1 TAPERPNT 27 (SUTURE) ×1 IMPLANT
SUT VIC AB 4-0 KS 27 (SUTURE) ×1 IMPLANT
TOWEL OR 17X24 6PK STRL BLUE (TOWEL DISPOSABLE) ×3 IMPLANT
TRAY FOLEY CATH SILVER 16FR (SET/KITS/TRAYS/PACK) ×1 IMPLANT
WATER STERILE IRR 1000ML POUR (IV SOLUTION) ×1 IMPLANT

## 2022-07-15 NOTE — Op Note (Signed)
Jeanne Williams PROCEDURE DATE: 07/15/2022  PREOPERATIVE DIAGNOSES: Intrauterine pregnancy at [redacted]w[redacted]d weeks gestation;  elective repeat  POSTOPERATIVE DIAGNOSES: The same, viable infant delivered  PROCEDURE: Repeat Low Transverse Cesarean Section with bilateral salpingectomy  SURGEON:  Dr. Karyl Kinnier  ASSISTANT:  Myrtie Hawk, DO An experienced assistant was required given the standard of surgical care given the complexity of the case.  This assistant was needed for exposure, dissection, suctioning, retraction, instrument exchange, assisting with delivery with administration of fundal pressure, and for overall help during the procedure.  ANESTHESIOLOGY TEAM: Anesthesiologist: Beryle Lathe, MD CRNA: Elbert Ewings, CRNA  INDICATIONS: Jeanne Williams is a 36 y.o. [phone] at [redacted]w[redacted]d here for cesarean section secondary to the indications listed under preoperative diagnoses; please see preoperative note for further details.  The risks of surgery were discussed with the patient including but were not limited to: bleeding which may require transfusion or reoperation; infection which may require antibiotics; injury to bowel, bladder, ureters or other surrounding organs; injury to the fetus; need for additional procedures including hysterectomy in the event of a life-threatening hemorrhage; formation of adhesions; placental abnormalities wth subsequent pregnancies; incisional problems; thromboembolic phenomenon and other postoperative/anesthesia complications.  The patient concurred with the proposed plan, giving informed written consent for the procedure.    FINDINGS:  Viable female infant in cephalic presentation.  Apgars 9 and 9.  Amniotic fluid: clear.  Intact placenta, three vessel cord.  Normal uterus, fallopian tubes and ovaries bilaterally.  ANESTHESIA: spinal INTRAVENOUS FLUIDS: 2100 ml   ESTIMATED BLOOD LOSS: 507 ml URINE OUTPUT:  150 ml SPECIMENS: Placenta sent to L&D ., Right  and left fallopian tubes COMPLICATIONS: None immediate  PROCEDURE IN DETAIL:  The patient preoperatively received intravenous antibiotics and had sequential compression devices applied to her lower extremities.  She was then taken to the operating room where spinal anesthesia was found to be adequate. She was then placed in a dorsal supine position with a leftward tilt, and prepped and draped in a sterile manner.  A foley catheter was  placed into her bladder and attached to constant gravity.  After an adequate timeout was performed, a Pfannenstiel skin incision was made with scalpel and carried through to the underlying layer of fascia. The fascia was incised in the midline, and this incision was extended sharply with mayo scissors. The rectus muscles were separated in the midline and the peritoneum was entered bluntly.   The Alexis self-retaining retractor was introduced into the abdominal cavity.  Attention was turned to the lower uterine segment where a low transverse hysterotomy was made with a scalpel and extended bluntly in caudad and cephalad directions.  The infant was successfully delivered, the cord was clamped and cut after one minute, and the infant was handed over to the awaiting neonatology team. Uterine massage was then administered, and the placenta delivered intact with a three-vessel cord. The uterus was then cleared of clots and debris.  The hysterotomy was closed with 0-Vicryl in a running fashion.   Attention was turned to the pt's right tube which was grasped with a Babcock clamp and followed to its fimbriated end.  Two Kelly clamps used to clamp the mesosalpinx and tube with fimbriated end removed with Metzenbaum scissors.Two plain gut ties were used in a Heaney type stitch beneath each Kelly clamp for hemostasis. A similar process was carried out on the left to allow for bilateral salpingectomy.  The pelvis was cleared of all clot and debris. Hemostasis was confirmed on  all  surfaces. The uterus was once again inspected and found to be hemostatic. The retractor was removed.  The peritoneum was closed with a 2-0 Vicryl running stitch. The fascia was then closed using 0 Vicryl in a running fashion.  The subcutaneous layer was irrigated, any areas of bleeding were cauterized with the bovie,  was found to be hemostatic..The skin was closed with a 4-0 Vicryl subcuticular stitch. The patient tolerated the procedure well. Sponge, instrument and needle counts were correct x 3.  She was taken to the recovery room in stable condition.   Shelda Pal, [place] Fellow, Faculty practice [place] for [facility] 07/15/22  11:14 AM

## 2022-07-15 NOTE — Anesthesia Postprocedure Evaluation (Signed)
Anesthesia Post Note  Patient: Jeanne Williams  Procedure(s) Performed: CESAREAN SECTION WITH BILATERAL TUBAL LIGATION (Bilateral)     Patient location during evaluation: PACU Anesthesia Type: Spinal Level of consciousness: oriented and awake and alert Pain management: pain level controlled Vital Signs Assessment: post-procedure vital signs reviewed and stable Respiratory status: spontaneous breathing and respiratory function stable Cardiovascular status: blood pressure returned to baseline and stable Postop Assessment: spinal receding and no apparent nausea or vomiting Anesthetic complications: no   No notable events documented.  Last Vitals:  Vitals:   07/15/22 1213 07/15/22 1234  BP: 111/77 109/75  Pulse:  (!) 55  Resp:    Temp:  (!) 36.3 C  SpO2:  100%    Last Pain:  Vitals:   07/15/22 1234  TempSrc: Oral  PainSc:    Pain Goal: Patients Stated Pain Goal: 6 (07/15/22 1122)  LLE Motor Response: Purposeful movement (07/15/22 1213) LLE Sensation: Full sensation (07/15/22 1213) RLE Motor Response: Purposeful movement (07/15/22 1213) RLE Sensation: Full sensation (07/15/22 1213) L Sensory Level: L3-Anterior knee, lower leg (07/15/22 1213) R Sensory Level: L3-Anterior knee, lower leg (07/15/22 1213)    Audry Pili

## 2022-07-15 NOTE — Transfer of Care (Signed)
Immediate Anesthesia Transfer of Care Note  Patient: Jeanne Williams  Procedure(s) Performed: CESAREAN SECTION WITH BILATERAL TUBAL LIGATION (Bilateral)  Patient Location: PACU  Anesthesia Type:Spinal  Level of Consciousness: awake, alert  and oriented  Airway & Oxygen Therapy: Patient Spontanous Breathing  Post-op Assessment: Report given to RN and Post -op Vital signs reviewed and stable  Post vital signs: Reviewed and stable  Last Vitals:  Vitals Value Taken Time  BP 113/77 07/15/22 1122  Temp    Pulse 58 07/15/22 1124  Resp 13 07/15/22 1124  SpO2 100 % 07/15/22 1124  Vitals shown include unvalidated device data.  Last Pain:  Vitals:   07/15/22 0813  TempSrc: Oral         Complications: No notable events documented.

## 2022-07-15 NOTE — H&P (Signed)
Obstetric Preoperative History and Physical  Jeanne Williams is a 36 y.o. [id number] with IUP at [redacted]w[redacted]d presenting for presenting for scheduled cesarean section.  No acute concerns.   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   Prenatal labs and studies: ABO, Rh: --/--/B POS (09/20 1100) Antibody: NEG (09/20 1100) Rubella: 26.60 (03/29 1053) RPR: NON REACTIVE (09/20 1130)  HBsAg: Confirm. indicated (03/29 1053)  HIV: NON REACTIVE (09/20 1056)  JQZ:ESPQZRAQ/-- (09/06 1133)   Prenatal Transfer Tool  Maternal Diabetes: No Genetic Screening: Normal Maternal Ultrasounds/Referrals: Normal Fetal Ultrasounds or other Referrals:  None Maternal Substance Abuse:  No Significant Maternal Medications:  None Significant Maternal Lab Results: HBsAG positive, GBS positive  Past Medical History:  Diagnosis Date   Medical history non-contributory     Past Surgical History:  Procedure Laterality Date   CESAREAN SECTION      OB History  Gravida Para Term Preterm AB Living  3 2 2  0 0 2  SAB IAB Ectopic Multiple Live Births  0 0 0 0 2    # Outcome Date GA Lbr Len/2nd Weight Sex Delivery Anes PTL Lv  3 Current           2 Term 05/20/20 [redacted]w[redacted]d  2100 g F CS-Unspec   LIV  1 Term 01/19/13 [redacted]w[redacted]d  2500 g F CS-Unspec  Y LIV     Complications: Failure to Progress in First Stage    Social History   Socioeconomic History   Marital status: Married    Spouse name: Not on file   Number of  children: Not on file   Years of education: Not on file   Highest education level: Not on file  Occupational History   Not on file  Tobacco Use   Smoking status: Never   Smokeless tobacco: Never  Vaping Use   Vaping Use: Never used  Substance and Sexual Activity   Alcohol use: Never   Drug use: Never   Sexual activity: Yes  Other Topics Concern   Not on file  Social History Narrative   Not on file   Social Determinants of Health   Financial Resource Strain: Not on file  Food Insecurity: No Food Insecurity (07/15/2022)   Hunger Vital Sign    Worried About Running Out of Food in the Last Year: Never true    Ran Out of Food in the Last Year: Never true  Transportation Needs: No Transportation Needs (07/15/2022)   PRAPARE - Hydrologist (Medical): No    Lack of Transportation (Non-Medical): No  Physical Activity: Not on file  Stress: Not on file  Social Connections: Not on file    Family History  Problem Relation Age of Onset   Cancer Neg Hx    Diabetes Neg Hx    Hypertension Neg Hx    Stroke Neg Hx    Asthma Neg Hx     Medications Prior to Admission  Medication Sig Dispense Refill Last Dose   acetaminophen (TYLENOL)  500 MG tablet Take 500-1,000 mg by mouth every 6 (six) hours as needed for moderate pain, mild pain or headache.      diphenhydrAMINE (BENADRYL) 25 MG tablet Take 25 mg by mouth every 6 (six) hours as needed for allergies.   Past Week   Doxylamine-Pyridoxine 10-10 MG TBEC Take 1 tablet by mouth at bedtime. If nausea persists can take an additional pill in the morning (Patient taking differently: Take 1 tablet by mouth daily as needed (nausea). If nausea persists can take an additional pill in the morning) 60 tablet 0    omeprazole (PRILOSEC) 20 MG capsule Take 1 capsule (20 mg total) by mouth daily. 30 capsule 1 Past Week   Prenatal 28-0.8 MG TABS Take 1 tablet by mouth daily. 30 tablet 11 07/14/2022    No Known  Allergies  Review of Systems: Negative except for what is mentioned in HPI.  Physical Exam: Pulse 66   Temp 98.4 F (36.9 C) (Oral)   Resp 18   Ht 5' (1.524 m)   Wt 86.9 kg   LMP 10/15/2021   SpO2 99%   BMI 37.40 kg/m  FHR by Doppler: 132 bpm CONSTITUTIONAL: Well-developed, well-nourished female in no acute distress.  HENT:  Normocephalic, atraumatic, External right and left ear normal. Oropharynx is clear and moist EYES: Conjunctivae and EOM are normal. Pupils are equal, round, and reactive to light. No scleral icterus.  NECK: Normal range of motion, supple, no masses SKIN: Skin is warm and dry. No rash noted. Not diaphoretic. No erythema. No pallor. NEUROLGIC: Alert and oriented to person, place, and time. Normal reflexes, muscle tone coordination. No cranial nerve deficit noted. PSYCHIATRIC: Normal mood and affect. Normal behavior. Normal judgment and thought content. CARDIOVASCULAR: Normal heart rate noted, regular rhythm RESPIRATORY: Effort and breath sounds normal, no problems with respiration noted ABDOMEN: Soft, nontender, nondistended, gravid. Well-healed Pfannenstiel incision. PELVIC: Deferred MUSCULOSKELETAL: Normal range of motion. No edema and no tenderness. 2+ distal pulses.   Pertinent Labs/Studies:   Results for orders placed or performed during the hospital encounter of 07/14/22 (from the past 72 hour(s))  Rapid HIV screen (HIV 1/2 Ab+Ag)     Status: None   Collection Time: 07/14/22 10:56 AM  Result Value Ref Range   HIV-1 P24 Antigen - HIV24 NON REACTIVE NON REACTIVE    Comment: RESULT CALLED TO, READ BACK BY AND VERIFIED WITH: T. ROBERTSON, RN 1318 07/14/2022 BY MACEDA,J. (NOTE) Detection of p24 may be inhibited by biotin in the sample, causing false negative results in acute infection.    HIV 1/2 Antibodies NON REACTIVE NON REACTIVE   Interpretation (HIV Ag Ab)      A non reactive test result means that HIV 1 or HIV 2 antibodies and HIV 1 p24 antigen  were not detected in the specimen.    Comment: Performed at [facility], [address] [place], [place] [address]  Type and screen     Status: None   Collection Time: 07/14/22 11:00 AM  Result Value Ref Range   ABO/RH(D) B POS    Antibody Screen NEG    Sample Expiration      07/17/2022,2359 Performed at [facility], [address] [place], [place] [address]   RPR     Status: None   Collection Time: 07/14/22 11:30 AM  Result Value Ref Range   RPR Ser Ql NON REACTIVE NON REACTIVE    Comment: Performed at [facility], [address], [place], [place] [address]  CBC     Status: Abnormal   Collection Time: 07/14/22 11:30 AM  Result Value Ref Range   WBC 5.6 4.0 - 10.5 K/uL   RBC 3.46 (L) 3.87 - 5.11 MIL/uL   Hemoglobin 11.5 (L) 12.0 - 15.0 g/dL   HCT 32.9 (L) 92.4 - 26.8 %   MCV 100.0 80.0 - 100.0 fL   MCH 33.2 26.0 - 34.0 pg   MCHC 33.2 30.0 - 36.0 g/dL   RDW 34.1 96.2 - 22.9 %   Platelets 154 150 - 400 K/uL   nRBC 0.0 0.0 - 0.2 %    Comment: Performed at [facility], [address] [place], [place] [address]  Comprehensive metabolic panel     Status: Abnormal   Collection Time: 07/14/22 11:30 AM  Result Value Ref Range   Sodium 136 135 - 145 mmol/L   Potassium 3.5 3.5 - 5.1 mmol/L   Chloride 110 98 - 111 mmol/L   CO2 20 (L) 22 - 32 mmol/L   Glucose, Bld 95 70 - 99 mg/dL    Comment: Glucose reference range applies only to samples taken after fasting for at least 8 hours.   BUN <5 (L) 6 - 20 mg/dL   Creatinine, Ser 1.19 0.44 - 1.00 mg/dL   Calcium 8.7 (L) 8.9 - 10.3 mg/dL   Total Protein 6.3 (L) 6.5 - 8.1 g/dL   Albumin 2.7 (L) 3.5 - 5.0 g/dL   AST 23 15 - 41 U/L   ALT 15 0 - 44 U/L   Alkaline Phosphatase 123 38 - 126 U/L   Total Bilirubin 0.6 0.3 - 1.2 mg/dL   GFR, Estimated >41 >74 mL/min    Comment: (NOTE) Calculated using the CKD-EPI Creatinine Equation (2021)    Anion gap 6 5 - 15    Comment: Performed at [facility],  [address], [place], [place] [address]    Assessment and Plan :Denver Harder is a 36 y.o. G3P2002 at [redacted]w[redacted]d being admitted being admitted for scheduled cesarean section. The risks of cesarean section discussed with the patient included but were not limited to: bleeding which may require transfusion or reoperation; infection which may require antibiotics; injury to bowel, bladder, ureters or other surrounding organs; injury to the fetus; need for additional procedures including hysterectomy in the event of a life-threatening hemorrhage; placental abnormalities wth subsequent pregnancies, incisional problems, thromboembolic phenomenon and other postoperative/anesthesia complications. The patient concurred with the proposed plan, giving informed written consent for the procedure. Patient has been NPO since last night she will remain NPO for procedure. Anesthesia and OR aware. Preoperative prophylactic antibiotics and SCDs ordered on call to the OR. To OR when ready.    Federico Flake, MD, MPH, ABFM Attending Physician Center for [facility]

## 2022-07-16 LAB — CBC
HCT: 25.7 % — ABNORMAL LOW (ref 36.0–46.0)
Hemoglobin: 8.9 g/dL — ABNORMAL LOW (ref 12.0–15.0)
MCH: 34.5 pg — ABNORMAL HIGH (ref 26.0–34.0)
MCHC: 34.6 g/dL (ref 30.0–36.0)
MCV: 99.6 fL (ref 80.0–100.0)
Platelets: 123 10*3/uL — ABNORMAL LOW (ref 150–400)
RBC: 2.58 MIL/uL — ABNORMAL LOW (ref 3.87–5.11)
RDW: 13.7 % (ref 11.5–15.5)
WBC: 8.5 10*3/uL (ref 4.0–10.5)
nRBC: 0 % (ref 0.0–0.2)

## 2022-07-16 MED ORDER — SODIUM CHLORIDE 0.9 % IV SOLN
500.0000 mg | Freq: Once | INTRAVENOUS | Status: AC
  Administered 2022-07-16: 500 mg via INTRAVENOUS
  Filled 2022-07-16: qty 500

## 2022-07-16 NOTE — Progress Notes (Signed)
POSTPARTUM PROGRESS NOTE  POD #1  Subjective:  Jeanne Williams is a 36 y.o. G3P3003 s/p rLTCS at [redacted]w[redacted]d. No acute events overnight. She reports she is doing well. She denies any problems with ambulating, voiding or po intake. Denies nausea or vomiting. She has passed flatus. Pain is well controlled.  Lochia is adequate.  Objective: Blood pressure (!) 97/54, pulse (!) 53, temperature 98.1 F (36.7 C), temperature source Oral, resp. rate 17, height 5' (1.524 m), weight 86.9 kg, last menstrual period 10/15/2021, SpO2 100 %, unknown if currently breastfeeding.  Physical Exam:  General: alert, cooperative and no distress Chest: no respiratory distress Heart: regular rate, distal pulses intact Uterine Fundus: firm, appropriately tender DVT Evaluation: No calf swelling or tenderness Extremities: mild edema Skin: warm, dry; incision clean/dry/intact w/honeycomb dressing in place  Recent Labs    07/14/22 1130 07/16/22 0552  HGB 11.5* 8.9*  HCT 34.6* 25.7*    Assessment/Plan: Jeanne Williams is a 36 y.o. [id number] s/p rLTCS with B/l salpingectomy at [redacted]w[redacted]d for elective repeat.  POD#1 - Doing welll; pain well controlled. H/H appropriate  Routine postpartum care  OOB, ambulated  Lovenox for VTE prophylaxis Anemia: asymptomatic  IV Venofer today Contraception: BTL done Feeding: breast/bottle  Dispo: Plan for discharge in the next 24-48h.   LOS: 1 day   Shelda Pal, DO OB Fellow  07/16/2022, 7:01 AM

## 2022-07-17 DIAGNOSIS — Z9079 Acquired absence of other genital organ(s): Secondary | ICD-10-CM

## 2022-07-17 HISTORY — DX: Acquired absence of other genital organ(s): Z90.79

## 2022-07-17 MED ORDER — ACETAMINOPHEN 500 MG PO TABS: 1000.0000 mg | ORAL_TABLET | Freq: Four times a day (QID) | ORAL | 0 refills | Status: DC

## 2022-07-17 MED ORDER — FERROUS SULFATE 325 (65 FE) MG PO TABS: 325.0000 mg | ORAL_TABLET | Freq: Every day | ORAL | 0 refills | Status: DC

## 2022-07-17 MED ORDER — OXYCODONE HCL 5 MG PO TABS: 5.0000 mg | ORAL_TABLET | ORAL | 0 refills | Status: AC | PRN

## 2022-07-17 MED ORDER — IBUPROFEN 600 MG PO TABS: 600.0000 mg | ORAL_TABLET | Freq: Four times a day (QID) | ORAL | 0 refills | Status: DC

## 2022-07-17 NOTE — Progress Notes (Signed)
POSTPARTUM PROGRESS NOTE  POD #2  Subjective:  Jeanne Williams is a 36 y.o. G3P3003 s/p rLTCS at [redacted]w[redacted]d. No acute events overnight. She reports she is doing well. She denies any problems with ambulating, voiding or po intake. Denies nausea or vomiting. She has passed flatus. Pain is well controlled.  Lochia is adequate.  Objective: Blood pressure 112/68, pulse (!) 55, temperature 98.2 F (36.8 C), temperature source Oral, resp. rate 17, height 5' (1.524 m), weight 86.9 kg, last menstrual period 10/15/2021, SpO2 100 %, unknown if currently breastfeeding.  Physical Exam:  General: alert, cooperative and no distress Chest: no respiratory distress Heart: regular rate, distal pulses intact Uterine Fundus: firm, appropriately tender DVT Evaluation: No calf swelling or tenderness Extremities: mild edema Skin: warm, dry; incision clean/dry/intact w/honeycomb dressing in place  Recent Labs    07/14/22 1130 07/16/22 0552  HGB 11.5* 8.9*  HCT 34.6* 25.7*    Assessment/Plan: Jeanne Williams is a 36 y.o. E3P2951 s/p rLTCS with B/l salpingectomy at [redacted]w[redacted]d for elective repeat.  POD#1 - Doing welll; pain well controlled. H/H appropriate  Routine postpartum care  OOB, ambulated  Lovenox for VTE prophylaxis Anemia: asymptomatic  IV Venofer given yesterday  Contraception: BTL done Feeding: breast/bottle  Dispo: Plan for discharge tomorrow.    LOS: 2 days   Concepcion Living, MD OB Fellow  07/17/2022, 7:54 AM

## 2022-07-17 NOTE — Discharge Summary (Signed)
Postpartum Discharge Summary    Patient Name: Jeanne Williams DOB: Sep 01, 1986 MRN: [id number]  Date of admission: 07/15/2022 Delivery date:07/15/2022  Delivering provider: Caren Macadam  Date of discharge: 07/17/2022  Admitting diagnosis: S/P repeat low transverse C-section [Z98.891] Delivery of pregnancy by cesarean section [O82] Intrauterine pregnancy: [redacted]w[redacted]d     Secondary diagnosis:  Principal Problem:   S/P repeat low transverse C-section Active Problems:   Delivery of pregnancy by cesarean section   H/O bilateral salpingectomy  Additional problems: Anemia 2/2 acute blood loss    Discharge diagnosis: Term Pregnancy Delivered and Anemia                                              Post partum procedures: Venofer Augmentation: N/A Complications: None  Hospital course: Sceduled C/S   36 y.o. yo G3P3003 at [redacted]w[redacted]d was admitted to the hospital 07/15/2022 for scheduled cesarean section with the following indication:Elective Repeat.Delivery details are as follows:  Membrane Rupture Time/Date: 10:25 AM ,07/15/2022   Delivery Method:C-Section, Low Transverse  Details of operation can be found in separate operative note.  Patient had an uncomplicated postpartum course.  She is ambulating, tolerating a regular diet, passing flatus, and urinating well. Patient is discharged home in stable condition on  07/17/22        Newborn Data: Birth date:07/15/2022  Birth time:10:25 AM  Gender:Female  Living status:Living  Apgars:9 ,9  Weight:3030 g     Magnesium Sulfate received: No BMZ received: No Rhophylac:No MMR:No T-DaP:Given prenatally Flu: No Transfusion:Yes  Physical exam  Vitals:   07/16/22 0920 07/16/22 1315 07/16/22 1943 07/17/22 0459  BP: 107/74 114/79 103/60 112/68  Pulse: 71 64 61 (!) 55  Resp: $Remo'20 20 18 17  'VLliD$ Temp:  99 F (37.2 C) 98.9 F (37.2 C) 98.2 F (36.8 C)  TempSrc:  Oral Oral Oral  SpO2:      Weight:      Height:       General: alert, cooperative,  and no distress Lochia: appropriate Uterine Fundus: firm Incision: Healing well with no significant drainage DVT Evaluation: No evidence of DVT seen on physical exam. Labs: Lab Results  Component Value Date   WBC 8.5 07/16/2022   HGB 8.9 (L) 07/16/2022   HCT 25.7 (L) 07/16/2022   MCV 99.6 07/16/2022   PLT 123 (L) 07/16/2022      Latest Ref Rng & Units 07/14/2022   11:30 AM  CMP  Glucose 70 - 99 mg/dL 95   BUN 6 - 20 mg/dL <5   Creatinine 0.44 - 1.00 mg/dL 0.58   Sodium 135 - 145 mmol/L 136   Potassium 3.5 - 5.1 mmol/L 3.5   Chloride 98 - 111 mmol/L 110   CO2 22 - 32 mmol/L 20   Calcium 8.9 - 10.3 mg/dL 8.7   Total Protein 6.5 - 8.1 g/dL 6.3   Total Bilirubin 0.3 - 1.2 mg/dL 0.6   Alkaline Phos 38 - 126 U/L 123   AST 15 - 41 U/L 23   ALT 0 - 44 U/L 15    Edinburgh Score:    07/17/2022   12:27 PM  Edinburgh Postnatal Depression Scale Screening Tool  I have been able to laugh and see the funny side of things. 0  I have looked forward with enjoyment to things. 0  I have blamed myself unnecessarily when  things went wrong. 0  I have been anxious or worried for no good reason. 0  I have felt scared or panicky for no good reason. 0  Things have been getting on top of me. 0  I have been so unhappy that I have had difficulty sleeping. 0  I have felt sad or miserable. 0  I have been so unhappy that I have been crying. 0  The thought of harming myself has occurred to me. 0  Edinburgh Postnatal Depression Scale Total 0     After visit meds:  Allergies as of 07/17/2022   No Known Allergies      Medication List     STOP taking these medications    diphenhydrAMINE 25 MG tablet Commonly known as: BENADRYL   Doxylamine-Pyridoxine 10-10 MG Tbec   omeprazole 20 MG capsule Commonly known as: PRILOSEC       TAKE these medications    acetaminophen 500 MG tablet Commonly known as: TYLENOL Take 2 tablets (1,000 mg total) by mouth every 6 (six) hours. What changed:   how much to take when to take this reasons to take this   ibuprofen 600 MG tablet Commonly known as: ADVIL Take 1 tablet (600 mg total) by mouth every 6 (six) hours.   oxyCODONE 5 MG immediate release tablet Commonly known as: Oxy IR/ROXICODONE Take 1-2 tablets (5-10 mg total) by mouth every 4 (four) hours as needed for moderate pain.   Prenatal 28-0.8 MG Tabs Take 1 tablet by mouth daily.         Discharge home in stable condition Infant Feeding: Bottle and Breast Infant Disposition:home with mother Discharge instruction: per After Visit Summary and Postpartum booklet. Activity: Advance as tolerated. Pelvic rest for 6 weeks.  Diet: routine diet Future Appointments: Future Appointments  Date Time Provider [place]  09/22/2022  4:15 PM Comer, Okey Regal, MD [facility]   Follow up Visit:  Message sent to [facility] by Autry-Lott on 07/17/2022   Please schedule this patient for a In person postpartum visit in 6 weeks with the following provider: MD and APP. Additional Postpartum F/U:Incision check 1 week  High risk pregnancy complicated by:  AMA, Chronic Hep B Delivery mode:  C-Section, Low Transverse  Anticipated Birth Control:  BTL done [facility]   07/17/2022 Oktober Glazer Autry-Lott, DO

## 2022-07-19 LAB — SURGICAL PATHOLOGY

## 2022-07-21 ENCOUNTER — Ambulatory Visit (INDEPENDENT_AMBULATORY_CARE_PROVIDER_SITE_OTHER): Payer: Medicaid Other

## 2022-07-21 ENCOUNTER — Encounter: Payer: Medicaid Other | Admitting: Family Medicine

## 2022-07-21 VITALS — BP 138/84 | HR 64 | Temp 99.3°F | Wt 189.5 lb

## 2022-07-21 DIAGNOSIS — Z5189 Encounter for other specified aftercare: Secondary | ICD-10-CM

## 2022-07-21 NOTE — Progress Notes (Signed)
Incision Check Visit  Jeanne Williams is here for incision check following repeat c-section on 07/15/22. Visit begun with [employer] interpreter Khassim ID [phone] without husband present. Pt prefers husband to interpret. Explained interpreter needs to be present. Patient and husband are agreeable, but prefer to speak [place]. No [employer] [place] interpreter available; husband will interpret visit.  Patient reports pain is well controlled with PRN medications. Describes nausea, vomiting, and dizziness when taking oxycodone. Recommended pt rotate Tylenol and ibuprofen without oxycodone. Pt will follow up if pain is not managed.   Honeycomb dressing removed from incision. Incision is clean, dry, and intact. Immediate surrounding area appears WNL. A few inches above the incision, skin appears darker and feels warm to the touch. Edema also present. Dione Plover, MD to bedside for assessment, who states this appears to be a hematoma related to surgery. Provider states this should resolve without intervention. Recommends follow up in 1 week to reassess. Pt will return earlier if any signs of infection are noted.  Annabell Howells, RN 07/21/2022  5:05 PM

## 2022-07-26 ENCOUNTER — Ambulatory Visit: Payer: Medicaid Other

## 2022-07-29 ENCOUNTER — Ambulatory Visit (INDEPENDENT_AMBULATORY_CARE_PROVIDER_SITE_OTHER): Payer: Medicaid Other

## 2022-07-29 ENCOUNTER — Other Ambulatory Visit: Payer: Self-pay

## 2022-07-29 VITALS — BP 140/93 | HR 66 | Wt 181.4 lb

## 2022-07-29 DIAGNOSIS — O9081 Anemia of the puerperium: Secondary | ICD-10-CM

## 2022-07-29 DIAGNOSIS — G8918 Other acute postprocedural pain: Secondary | ICD-10-CM

## 2022-07-29 DIAGNOSIS — Z5189 Encounter for other specified aftercare: Secondary | ICD-10-CM

## 2022-07-29 MED ORDER — FERROUS SULFATE 325 (65 FE) MG PO TABS: 325.0000 mg | ORAL_TABLET | ORAL | 1 refills | Status: AC

## 2022-07-29 MED ORDER — ACETAMINOPHEN 500 MG PO TABS: 1000.0000 mg | ORAL_TABLET | Freq: Four times a day (QID) | ORAL | 0 refills | Status: AC

## 2022-07-29 MED ORDER — IBUPROFEN 600 MG PO TABS: 600.0000 mg | ORAL_TABLET | Freq: Four times a day (QID) | ORAL | 0 refills | Status: AC

## 2022-07-29 NOTE — Progress Notes (Signed)
Incision Check Visit  Jeanne Williams is here for incision check following repeat c-section on 07/15/22. Pt seen for incision check visit 1 week ago and follow up was indicated (see progress note 07/22/22). Incision is clean, dry, and intact. Area above incision continues to feel firm to touch. No warmth felt in this area. Reviewed good wound care and s/s of infection with patient.  Pt requests refill of ibuprofen, Tylenol, and iron supplement. Medications refilled.  BP today is elevated at 140/93. Pt states she will not take BP med if prescribed. Ervin MD to bedside for assessment. Provider states no intervention is needed at this time for incision site. Recommends BP med to patient, who confirms she will not take. Provider reviews need for patient to check BP at home and contact office if BP reaches 150/95. Pt verbalizes understanding. Provider requests PP visit before end of the month. [place] office to reschedule during check out.   Encounter completed with [employer] [place] interpreter Demetrius Revel present virtually.  Annabell Howells, RN 07/29/2022  3:42 PM

## 2022-07-29 NOTE — Patient Instructions (Addendum)
BLOOD PRESSURE: should stay under 388/82  Systolic (top): 800 Diastolic (bottom): 95  **Call our office if BP is higher!**

## 2022-08-23 ENCOUNTER — Ambulatory Visit: Payer: Self-pay | Admitting: Obstetrics and Gynecology

## 2022-08-23 ENCOUNTER — Ambulatory Visit: Payer: Medicaid Other | Admitting: Obstetrics and Gynecology

## 2022-09-14 ENCOUNTER — Encounter: Payer: Self-pay | Admitting: Advanced Practice Midwife

## 2022-09-14 ENCOUNTER — Ambulatory Visit (INDEPENDENT_AMBULATORY_CARE_PROVIDER_SITE_OTHER): Payer: Medicaid Other | Admitting: Advanced Practice Midwife

## 2022-09-14 ENCOUNTER — Ambulatory Visit: Payer: Medicaid Other | Admitting: Advanced Practice Midwife

## 2022-09-14 DIAGNOSIS — Z9851 Tubal ligation status: Secondary | ICD-10-CM | POA: Diagnosis not present

## 2022-09-14 NOTE — Progress Notes (Signed)
Post Partum Visit Note  Jeanne Williams is a 36 y.o. G44P3003 female who presents for a postpartum visit. She is 8 weeks postpartum following a repeat cesarean section.  I have fully reviewed the prenatal and intrapartum course. The delivery was at 39 gestational weeks.  Anesthesia: spinal. Postpartum course has been going well. Baby is doing well. Baby is feeding by bottle - Enfamil with Iron. Bleeding like a period. Bowel function is normal. Bladder function is complicated by leaking urine C/W urinary urgency. Patient is sexually active. Contraception method is tubal ligation. Postpartum depression screening: negative.  Reports moderate vaginal bleeding x 2 weeks after delivery then light bleeding until she started bleeding like a period 08/20/22-08/28/22. Stopped bleeding. Bleeding started again yesterday like a period.    Upstream - 09/14/22 1224       Pregnancy Intention Screening   Does the patient want to become pregnant in the next year? No    Does the patient's partner want to become pregnant in the next year? No    Would the patient like to discuss contraceptive options today? No      Contraception Wrap Up   Current Method Female Sterilization    End Method Female Sterilization    Contraception Counseling Provided No    How was the end contraceptive method provided? N/A             Upstream - 09/14/22 1224       Pregnancy Intention Screening   Does the patient want to become pregnant in the next year? No    Does the patient's partner want to become pregnant in the next year? No    Would the patient like to discuss contraceptive options today? No      Contraception Wrap Up   Current Method Female Sterilization    End Method Female Sterilization    Contraception Counseling Provided No    How was the end contraceptive method provided? N/A            The pregnancy intention screening data noted above was reviewed. Potential methods of contraception were discussed.  The patient elected to proceed with Female Sterilization.    Edinburgh Postnatal Depression Scale - 09/14/22 1108       Edinburgh Postnatal Depression Scale:  In the Past 7 Days   I have been able to laugh and see the funny side of things. 0    I have looked forward with enjoyment to things. 0    I have blamed myself unnecessarily when things went wrong. 0    I have been anxious or worried for no good reason. 0    I have felt scared or panicky for no good reason. 0    Things have been getting on top of me. 0    I have been so unhappy that I have had difficulty sleeping. 0    I have felt sad or miserable. 0    I have been so unhappy that I have been crying. 0    The thought of harming myself has occurred to me. 0    Edinburgh Postnatal Depression Scale Total 0             Health Maintenance Due  Topic Date Due   COVID-19 Vaccine (2 - 2023-24 season) 06/25/2022    The following portions of the patient's history were reviewed and updated as appropriate: allergies, current medications, past family history, past medical history, past social history, past surgical history, and problem  list.  Review of Systems Pertinent items are noted in HPI.  Objective:  BP 123/81   Pulse 65   LMP 09/14/2022   Breastfeeding No    General:  alert, cooperative, appears stated age, no distress, and no pallor   Breasts:  not indicated  Lungs: Normal rate and effort  Heart:  Normal rate  Abdomen: soft, non-tender; bowel sounds normal; no masses,  no organomegaly   Wound well approximated incision  GU exam:   Declined       Assessment:   1. Delayed postpartum hemorrhage  - CBC - Beta hCG quant (ref lab)  2. Postpartum care and examination    Plan:   Essential components of care per ACOG recommendations:  1.  Mood and well being: Patient with negative depression screening today. Reviewed local resources for support.  - Patient tobacco use? No.   - hx of drug use? No.    2.  Infant care and feeding:  -Patient currently breastmilk feeding? No.  -Social determinants of health (SDOH) reviewed in EPIC. No concerns.   3. Sexuality, contraception and birth spacing - Patient does not want a pregnancy in the next year.  Desired family size is 3 children.  - Reviewed reproductive life planning. Reviewed contraceptive methods based on pt preferences and effectiveness.  Patient desired Female Sterilization today.   - Discussed birth spacing of 18 months  4. Sleep and fatigue -Encouraged family/partner/community support of 4 hrs of uninterrupted sleep to help with mood and fatigue  5. Physical Recovery  - Discussed patients delivery and complications. She describes her labor as good. - Patient had a C-section repeat; no problems after deliver. Patient had a  NA  laceration. Perineal healing reviewed. Patient expressed understanding - Patient has urinary incontinence? Yes. Discussed role of pelvic floor PT. Offered PT and patient declined.  - Patient is safe to resume physical and sexual activity  6.  Health Maintenance - HM due items addressed No - - - Last pap smear  Diagnosis  Date Value Ref Range Status  03/04/2022   Final   - Negative for intraepithelial lesion or malignancy (NILM)   Pap smear not done at today's visit.  -Breast Cancer screening indicated? No.   7. Chronic Disease/Pregnancy Condition follow up: None  - PCP follow up  Dorathy Kinsman, CNM [facility] [employer], [facility]

## 2022-09-14 NOTE — Patient Instructions (Signed)
AREA FAMILY PRACTICE PHYSICIANS  [place] ([address]) [facility] [address] [place], [place] [address] [phone] Mon-Fri 8:30-12:30, 1:30-5:00 Accepting Medicaid [facility] [address], [place], [place] [address] [phone] Mon-Fri 8:00-5:30 [facility] [address] [place], [place] [address] [phone] Mon, Tue, Thur, Fri 8:30-5:00, Wed 10:00-7:00 (closed 1-2pm) Accepting Medicaid [facility] [address], [place], [place]  [address] Phone - [phone]   Fax - [phone]  [place] ([address]) [facility] [address] [place], [place] [address] [phone] Mon-Fri 8:00-5:00 [facility] ([facility])  [address] [place], [place] [address] [phone] Mon-Fri 8:30-5:30, Sat (Oct.-Mar.) 9:00-1:00 Accepting Medicaid  [place] ([address]) [facility] [address], [place], [place] [address] [phone] Mon-Fri 8:00-5:00  [place] ([address]) [facility] [address], [place], [place] [address] [phone] Mon-Fri 8:00-5:00 [facility] [address], [place], [place] [address] [phone] Mon-Fri 8:00-5:00 [facility] [address] [place], [place] [address] [phone] Mon-Fri 8:00-5:00 [facility] [address] [place] [address] [phone] Mon-Fri 7:30-5:30  [place] ([address] & [address]) [facility] [address] [place], [place] [address] [phone] Mon-Thur 8:00-6:00 Accepting Medicaid [facility] [address] [place], [place] [address] [phone] Mon-Thur 7:30-7:30, Fri 7:30-4:30 Accepting  Medicaid [facility] [address], [place], [place]  [address] [phone]   Fax - [phone]  [place] ([address] & [address]) [facility] [address] , [place] [address] [phone] Mon-Fri 7:00-5:00 [facility] [address], [place], [place] [address] [phone] Mon-Fri 8:00-5:00 Accepting Medicaid [facility] [address], , [place] [address] [phone] Mon-Fri 8:00-5:00 Accepting Medicaid  [place] ([address]) [facility] [place] [address] [place], [place] [address] [phone] Mon-Fri 8:00-5:00 [facility] [address], [place], [place] [address] [phone] Mon-Fri 8:00-5:00 Accepting Medicaid [facility] ([facility]) [address], [place], [place] [address] [phone] Mon-Fri 8:00-5:30, Sat&Sun by appointment (phones open at 8:30) Accepting Medicaid  [place] ([address]) [facility] [address] [place], [place] [address] [phone] Mon-Thur 8:00-7:00, Fri 8:00-5:00, Sat 8:00-12:00, Sun 9:00-12:00 Accepting Medicaid [facility] [place] [address], [place], [place] [address] [phone] Mon-Thur 8:00-5:00 Accepting Medicaid [facility] [address] [place], [place] [address] [phone] Mon-Fri 8:00-5:30, Sat (Oct.-Mar.) 9:00-1:00 Accepting Medicaid  [place] ([address]) [facility] [address], [place], [place] [address] [phone] Mon-Fri 8:00-5:00 Accepting Medicaid   [place] ([address]) [facility] [place] [address], [place], [place] [address] [phone] Mon-Fri 8:00-5:00 [facility] [place] [address], [place], [place] [address] [phone] Mon-Fri 8:00-5:00 [facility] [place] [address] [place] [address], [place], [place] [address] [phone] Mon-Fri 8:00-5:00 After hours clinic ([address] [place], [place] [address]) [phone] Mon-Fri 5:00-8:00, Sat 12:00-6:00, Sun 10:00-4:00 Accepting Medicaid [facility] [place]   [address], [place], [place]  [address] [phone]   Fax - [phone]  [place] ([address]) [facility] [place] [facility] [address], [place], [place] [address] [phone] Mon-Fri 8:00-5:00 [facility] [place] ([facility] [place]) [address], [place], [place] [address] [phone] Mon-Thur 8:00-7:00, Fri 8:00-5:00, Sat 8:00-12:00    

## 2022-09-15 LAB — CBC
Hematocrit: 39.1 % (ref 34.0–46.6)
Hemoglobin: 12.8 g/dL (ref 11.1–15.9)
MCH: 32.1 pg (ref 26.6–33.0)
MCHC: 32.7 g/dL (ref 31.5–35.7)
MCV: 98 fL — ABNORMAL HIGH (ref 79–97)
Platelets: 213 10*3/uL (ref 150–450)
RBC: 3.99 x10E6/uL (ref 3.77–5.28)
RDW: 11.8 % (ref 11.7–15.4)
WBC: 5.4 10*3/uL (ref 3.4–10.8)

## 2022-09-15 LAB — BETA HCG QUANT (REF LAB): hCG Quant: <1 m[IU]/mL

## 2022-09-22 ENCOUNTER — Ambulatory Visit: Payer: Medicaid Other | Admitting: Internal Medicine

## 2022-10-26 ENCOUNTER — Ambulatory Visit: Payer: Medicaid Other | Admitting: Internal Medicine

## 2023-01-28 ENCOUNTER — Ambulatory Visit: Payer: Medicaid Other | Admitting: Internal Medicine

## 2023-03-03 ENCOUNTER — Ambulatory Visit (INDEPENDENT_AMBULATORY_CARE_PROVIDER_SITE_OTHER): Payer: Medicaid Other | Admitting: Internal Medicine

## 2023-03-03 ENCOUNTER — Other Ambulatory Visit: Payer: Self-pay

## 2023-03-03 ENCOUNTER — Encounter: Payer: Self-pay | Admitting: Internal Medicine

## 2023-03-03 VITALS — BP 116/61 | HR 66 | Temp 97.7°F | Ht 60.0 in | Wt 163.0 lb

## 2023-03-03 DIAGNOSIS — B181 Chronic viral hepatitis B without delta-agent: Secondary | ICD-10-CM

## 2023-03-03 NOTE — Assessment & Plan Note (Addendum)
Will repeat her labs with the DNA and LFTs.   Will check her elastography now that she is not pregnant and complete the work up.  I discussed the reasoning of the elastography and serial ultrasounds including screening for [facility] which will start now in her case.   Discussed monitoring otherwise and she will return in 6 months.   I have personally spent 30 minutes involved in face-to-face and non-face-to-face activities for this patient on the day of the visit. Professional time spent includes the following activities: Preparing to see the patient (review of tests), Obtaining and/or reviewing separately obtained history (admission/discharge record), Performing a medically appropriate examination and/or evaluation , Ordering medications/tests/procedures, referring and communicating with other health care professionals, Documenting clinical information in the EMR, Independently interpreting results (not separately reported), Communicating results to the patient/family/caregiver, Counseling and educating the patient/family/caregiver and Care coordination (not separately reported).

## 2023-03-03 NOTE — Progress Notes (Signed)
   Subjective:    Patient ID: Jeanne Williams, female    DOB: 20-May-1986, 37 y.o.   MRN: [id number]  HPI Jeanne Williams is here for follow up of chronic, inactive hepatitis B. She is hepatitis D negative, surface Ag positive and E Ag negative with a low viral DNA, last being 885.  No transaminitis.  She is here for follow up after her pregnancy last year.  There has been no indication for treatment.     Review of Systems  Constitutional:  Negative for fatigue.  Gastrointestinal:  Negative for diarrhea and nausea.       Objective:   Physical Exam Eyes:     General: No scleral icterus. Pulmonary:     Effort: Pulmonary effort is normal.  Neurological:     Mental Status: She is alert.   SH: no alcohol        Assessment & Plan:

## 2023-03-06 LAB — HEPATIC FUNCTION PANEL
AG Ratio: 1.4 (calc) (ref 1.0–2.5)
ALT: 15 U/L (ref 6–29)
AST: 17 U/L (ref 10–30)
Albumin: 4.1 g/dL (ref 3.6–5.1)
Alkaline phosphatase (APISO): 64 U/L (ref 31–125)
Bilirubin, Direct: 0.1 mg/dL (ref 0.0–0.2)
Globulin: 2.9 g/dL (calc) (ref 1.9–3.7)
Indirect Bilirubin: 0.3 mg/dL (calc) (ref 0.2–1.2)
Total Bilirubin: 0.4 mg/dL (ref 0.2–1.2)
Total Protein: 7 g/dL (ref 6.1–8.1)

## 2023-03-06 LAB — HEPATITIS A ANTIBODY, TOTAL: Hepatitis A AB,Total: REACTIVE — AB

## 2023-03-06 LAB — HEPATITIS B DNA, ULTRAQUANTITATIVE, PCR
Hepatitis B DNA: 1700 IU/mL — ABNORMAL HIGH
Hepatitis B virus DNA: 3.23 Log IU/mL — ABNORMAL HIGH

## 2023-03-10 ENCOUNTER — Ambulatory Visit (HOSPITAL_COMMUNITY)
Admission: RE | Admit: 2023-03-10 | Discharge: 2023-03-10 | Disposition: A | Discharge status: Home / Self Care | Payer: Medicaid Other | Source: Ambulatory Visit | Attending: Internal Medicine | Admitting: Internal Medicine

## 2023-03-10 DIAGNOSIS — B181 Chronic viral hepatitis B without delta-agent: Secondary | ICD-10-CM | POA: Insufficient documentation

## 2023-03-15 ENCOUNTER — Telehealth: Payer: Self-pay

## 2023-03-15 NOTE — Telephone Encounter (Signed)
Called using [employer] to discuss results. Advised Ultrasound  is WNL no Fibrosis. Patient understood.

## 2023-08-24 IMAGING — US US MFM OB DETAIL+14 WK
1 series · 13 of 28 positions shown · non-contrast
Comparison: none

[Series 1: us mfm ob detail+14 wk · 97 acquisitions, 13 frames shown]
[im 4/97]
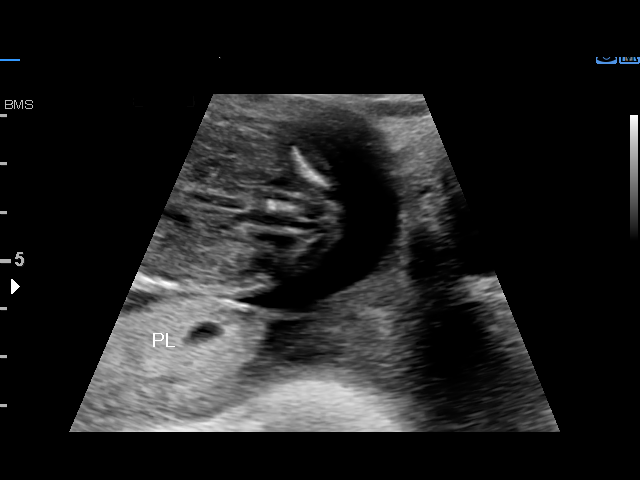
[im 11/97]
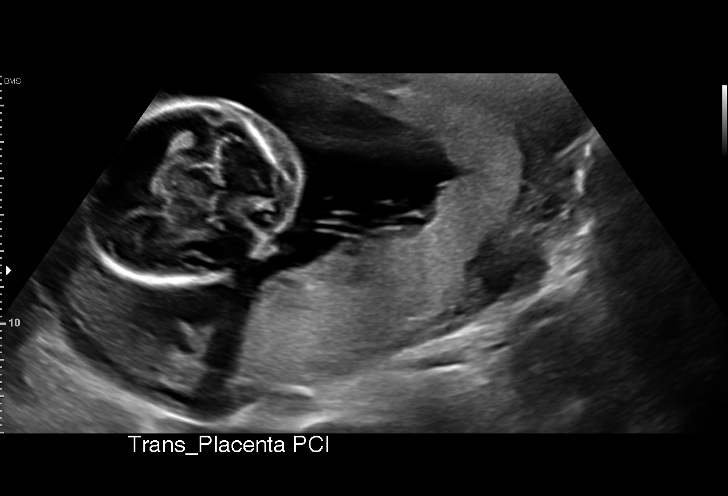
[im 18/97]
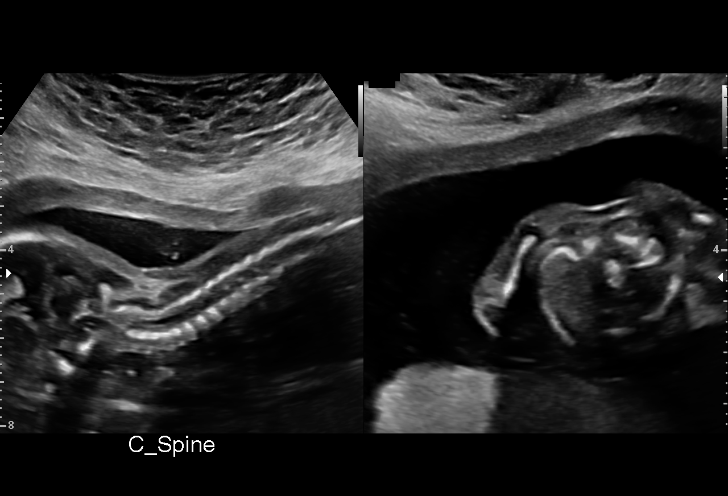
[im 25/97]
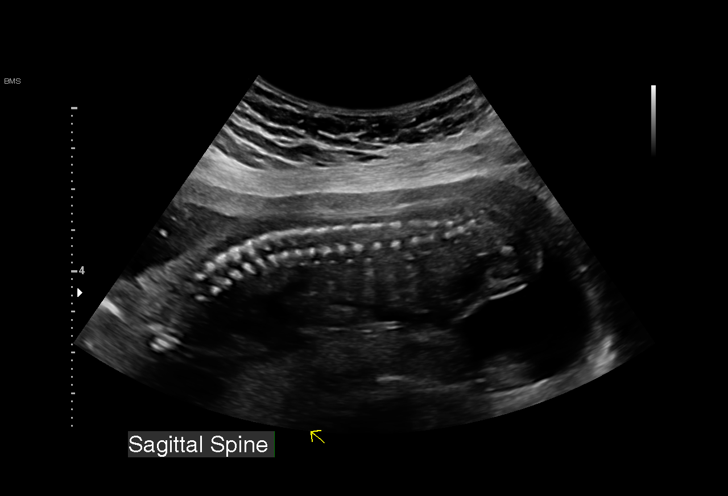
[im 33/97]
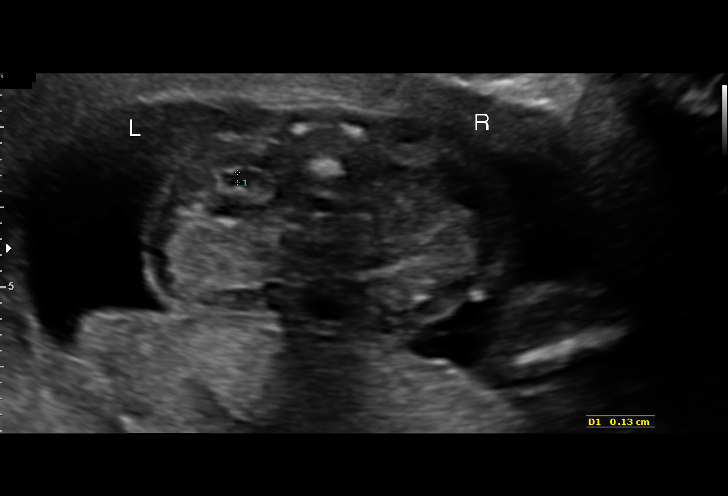
[im 40/97]
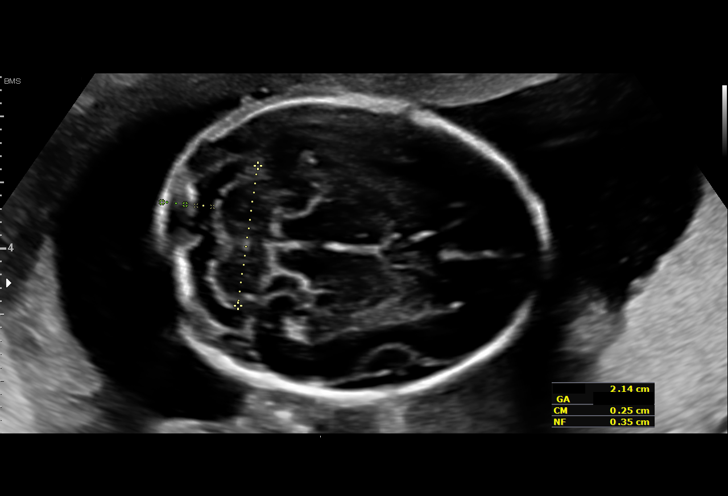
[im 50/97]
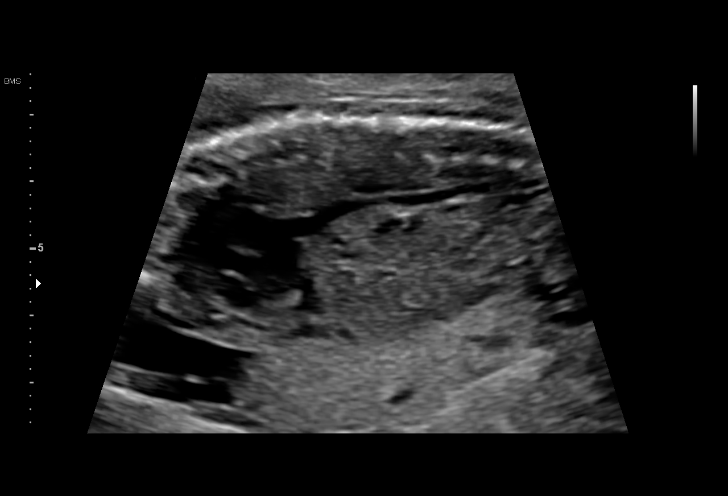
[im 57/97]
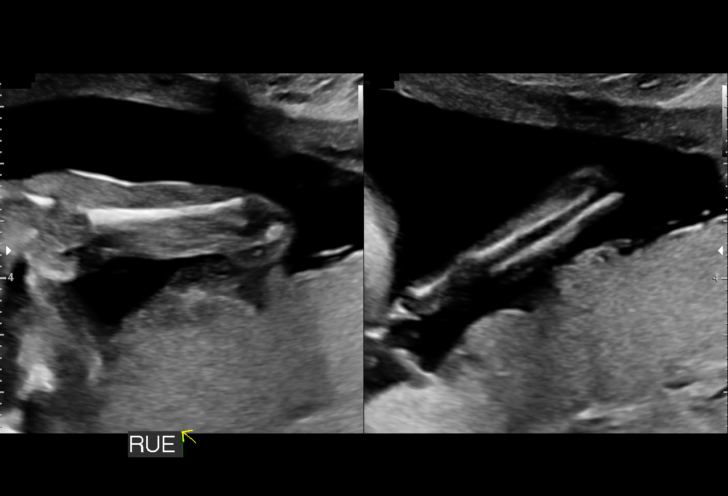
[im 65/97]
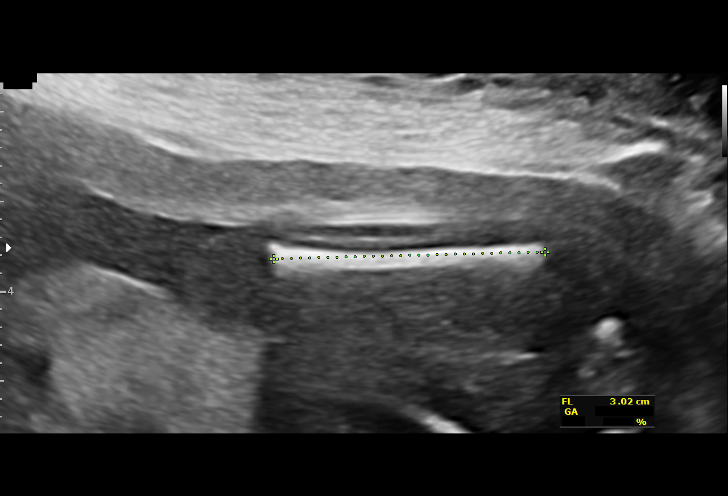
[im 72/97]
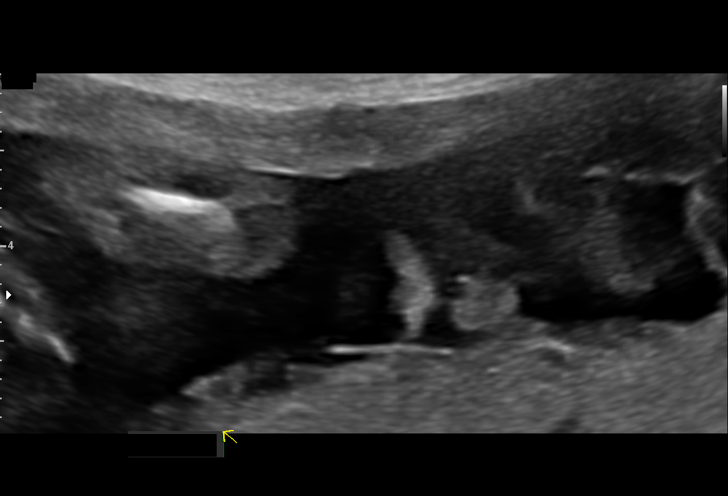
[im 79/97]
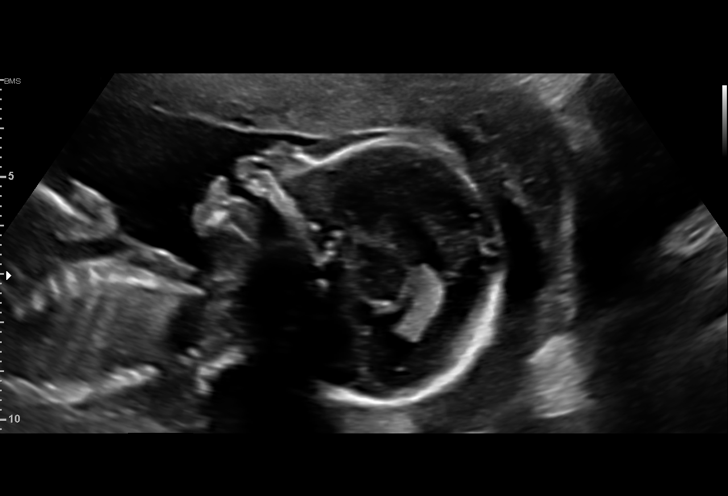
[im 86/97]
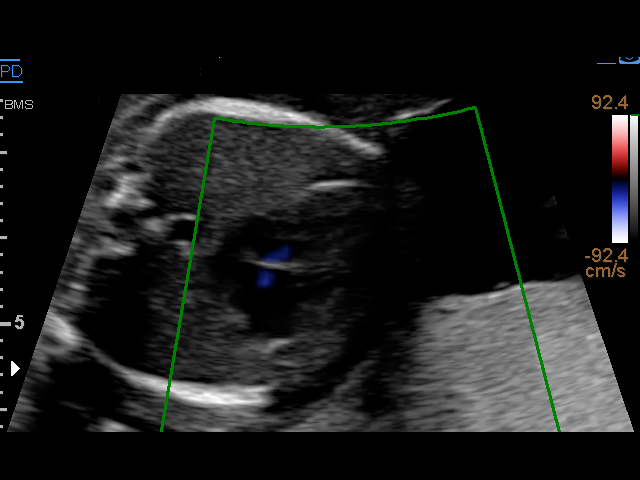
[im 93/97]
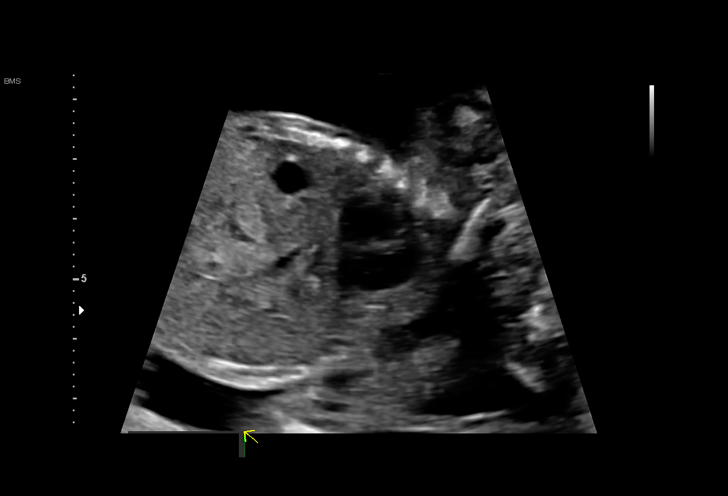

[13 of 28 positions shown; findings below may reference images not displayed]

Indications

 19 weeks gestation of pregnancy
 Encounter for antenatal screening for
 malformations
 Advanced maternal age multigravida 35+,
 second trimester
 History of cesarean delivery, currently
 pregnant
 Poor obstetrical history (SGA)
 Hepatitis B complicating pregnancy             O98.419,
 LR NIPS, NEg AFP, Neg Horizon
Fetal Evaluation

 Num Of Fetuses:         1
 Fetal Heart Rate(bpm):  160
 Cardiac Activity:       Observed
 Presentation:           Breech
 Placenta:               Posterior, low-lying,1.2 cm from int os
 P. Cord Insertion:      Visualized, central

 Amniotic Fluid
 AFI FV:      Within normal limits

                             Largest Pocket(cm)

Biometry

 BPD:      46.9  mm     G. Age:  20w 1d         88  %    CI:        79.29   %    70 - 86
                                                         FL/HC:      18.1   %    16.1 -
 HC:      166.5  mm     G. Age:  19w 2d         52  %    HC/AC:      1.21        1.09 -
 AC:      137.3  mm     G. Age:  19w 1d         46  %    FL/BPD:     64.4   %
 FL:       30.2  mm     G. Age:  19w 2d         50  %    FL/AC:      22.0   %    20 - 24
 HUM:      27.2  mm     G. Age:  18w 5d         38  %
 CER:        21  mm     G. Age:  20w 0d         82  %
 NFT:       3.5  mm

 LV:        7.1  mm
 CM:        2.5  mm

 Est. FW:     284  gm    0 lb 10 oz      54  %
OB History

 Gravidity:    3         Term:   2
 Living:       2
Gestational Age

 LMP:           19w 1d        Date:  10/15/21                 EDD:   07/22/22
 U/S Today:     19w 3d                                        EDD:   07/20/22
 Best:          19w 1d     Det. By:  LMP  (10/15/21)          EDD:   07/22/22
Anatomy

 Cranium:               Appears normal         Aortic Arch:            Appears normal
 Cavum:                 Appears normal         Ductal Arch:            Appears normal
 Ventricles:            Appears normal         Diaphragm:              Appears normal
 Choroid Plexus:        Appears normal         Stomach:                Appears normal, left
                                                                       sided
 Cerebellum:            Appears normal         Abdomen:                Appears normal
 Posterior Fossa:       Appears normal         Abdominal Wall:         Appears nml (cord
                                                                       insert, abd wall)
 Nuchal Fold:           Appears normal         Cord Vessels:           Appears normal (3
                                                                       vessel cord)
 Face:                  Appears normal         Kidneys:                Appear normal
                        (orbits and profile)
 Lips:                  Appears normal         Bladder:                Appears normal
 Thoracic:              Appears normal         Spine:                  Appears normal
 Heart:                 Appears normal         Upper Extremities:      Appears normal
                        (4CH, axis, and
                        situs)
 RVOT:                  Not well visualized    Lower Extremities:      Appears normal
 LVOT:                  Not well visualized

 Other:  VC, 3VV and 3VTV visualized. Heels/feet and open hands/5th digits
         visualized. Fetus appears to be female. Technically difficult due to
         maternal habitus and fetal position.
Cervix Uterus Adnexa

 Cervix
 Length:           3.98  cm.
 Normal appearance by transabdominal scan.

 Uterus
 No abnormality visualized.

 Right Ovary
 Within normal limits.
 Left Ovary
 Not visualized.

 Cul De Sac
 No free fluid seen.

 Adnexa
 No adnexal mass visualized.
Impression

 Single intrauterine pregnancy here for a detailed anatomy
 due to advanced maternal age.
 Normal anatomy with measurements consistent with dates
 There is good fetal movement and amniotic fluid volume
 Suboptimal views of the fetal anatomy were obtained
 secondary to fetal position.

 A low lying placenta was observed today at 1.2 cm from the
 internal os. I explained to Ms. Quirijn that this will likely
 resolve by 35 weeks. We will reevaluate at 28 and at 32
 weeks if not resolved.

 Bleeding precautions provided.
Recommendations

 Follow up growth in 4 weeks
 Follow up placental edge in 8 weeks.

## 2023-09-13 ENCOUNTER — Ambulatory Visit: Payer: Self-pay | Admitting: Internal Medicine

## 2023-09-21 IMAGING — US US MFM OB FOLLOW-UP
1 series · 13 of 28 positions shown · non-contrast
Comparison: none

[Series 1: us mfm ob follow-up · 13 of 71 slices shown]
[im 3/71]
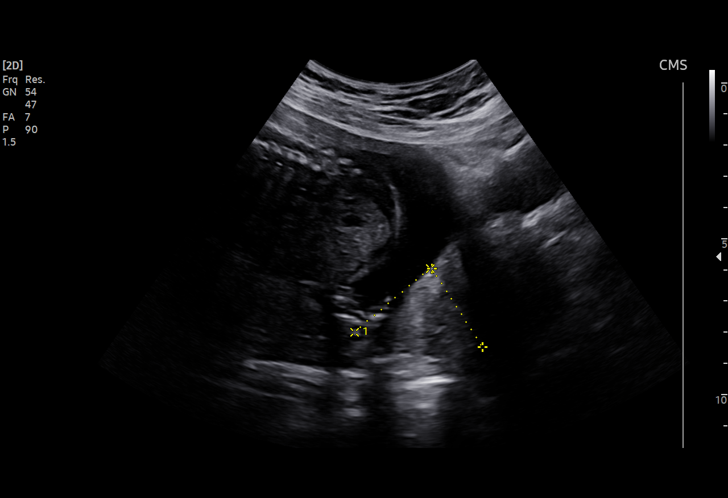
[im 8/71]
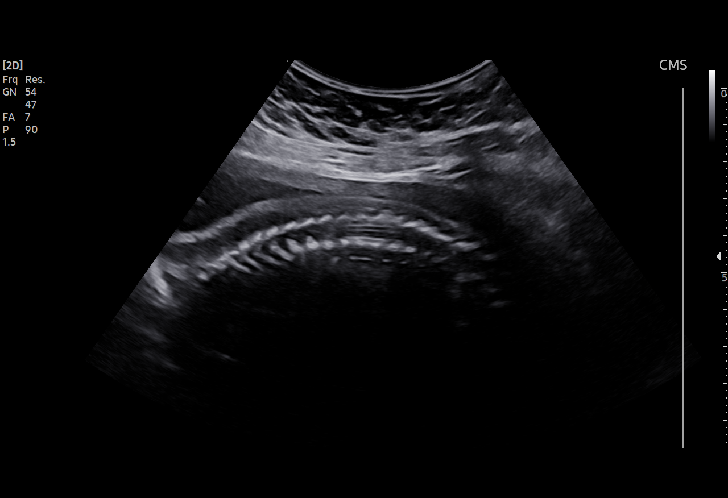
[im 13/71]
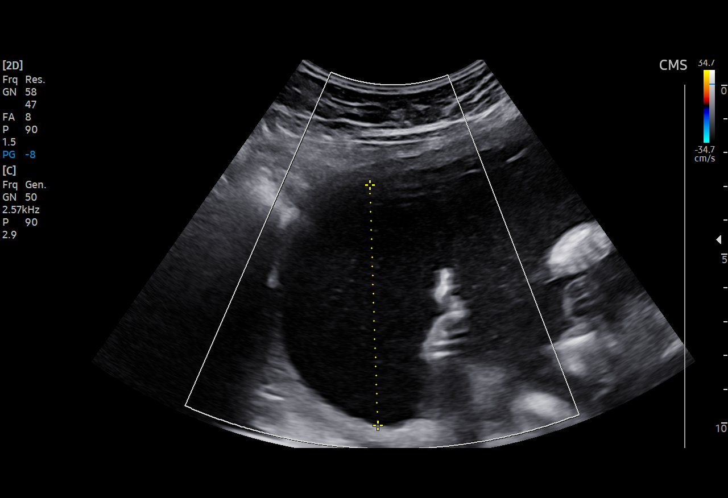
[im 19/71]
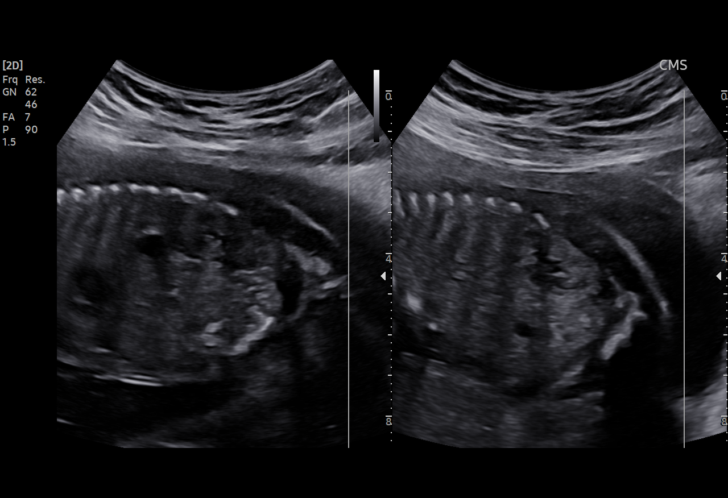
[im 24/71]
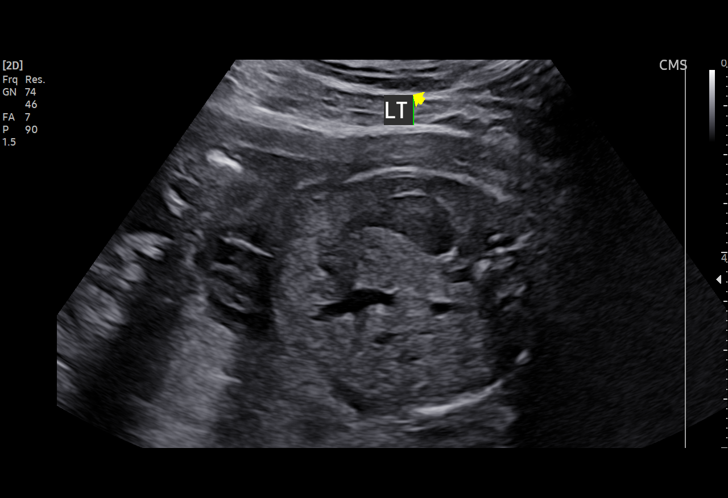
[im 29/71]
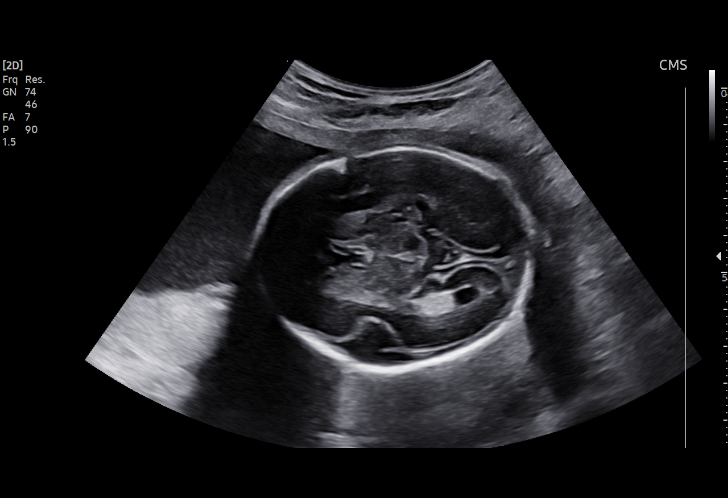
[im 37/71]
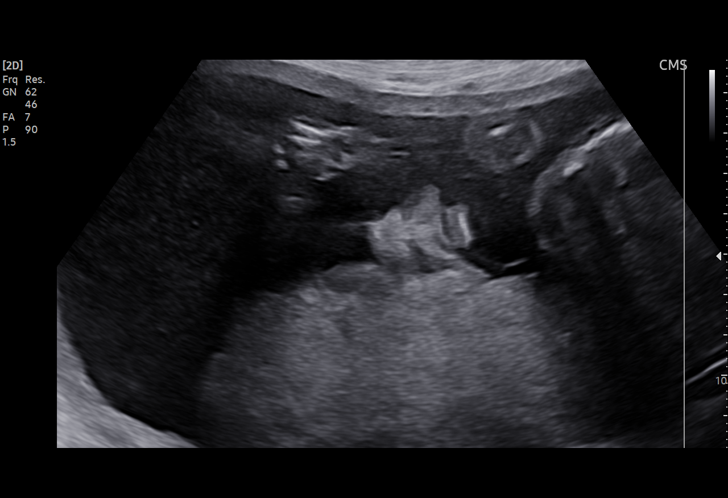
[im 42/71]
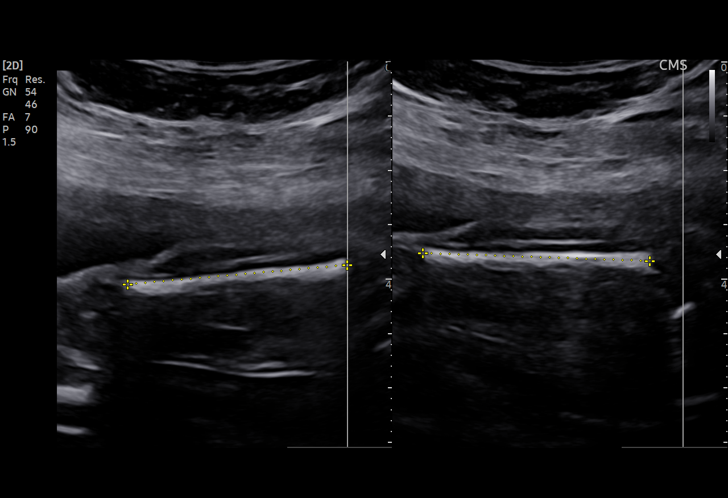
[im 47/71]
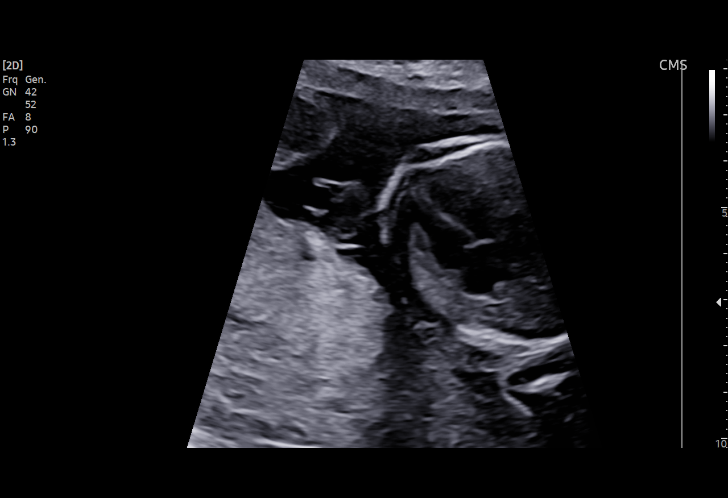
[im 52/71]
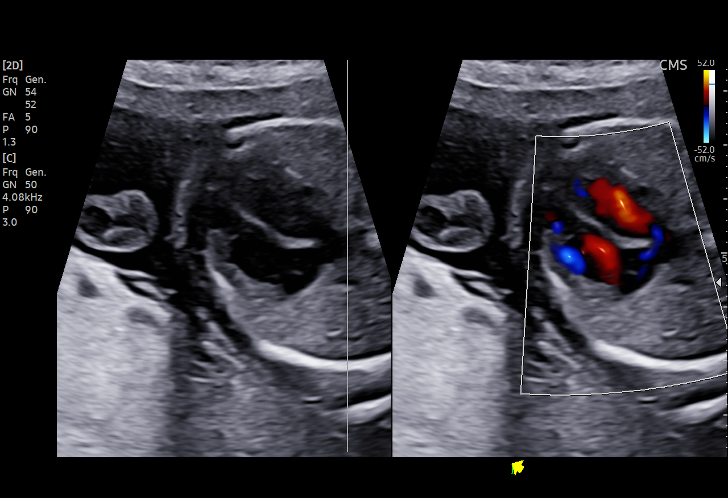
[im 58/71]
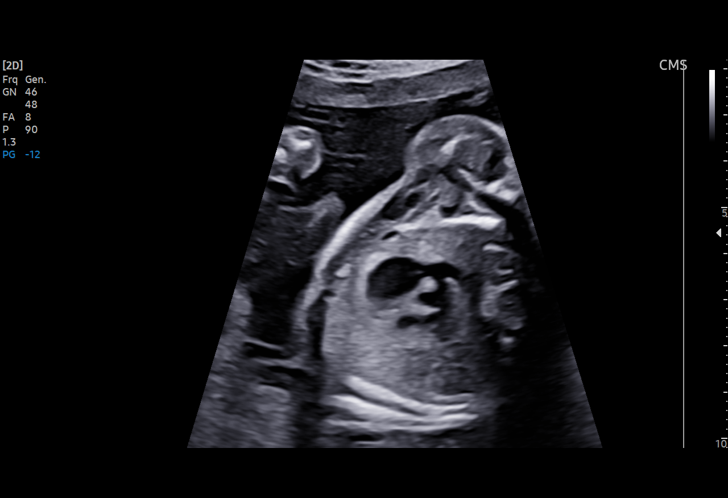
[im 63/71]
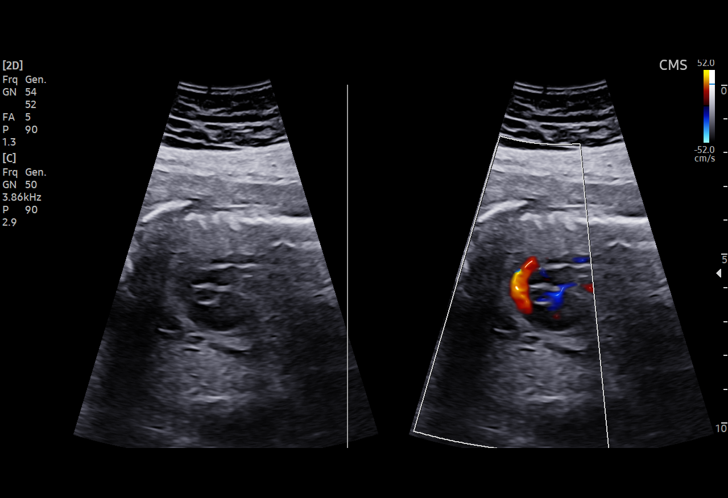
[im 68/71]
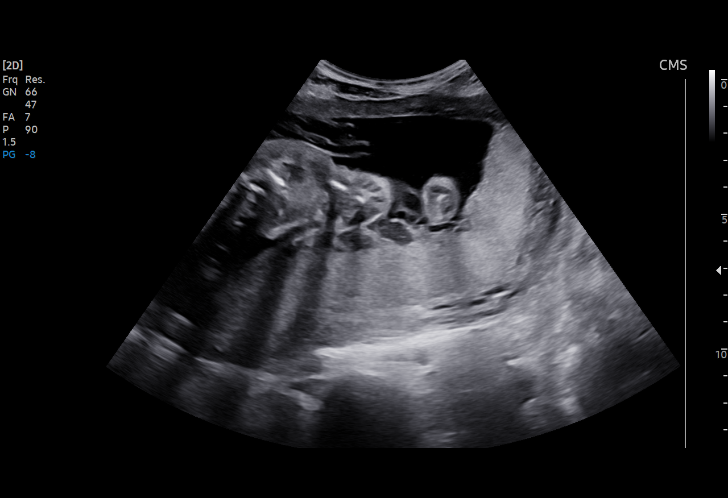

[13 of 28 positions shown; findings below may reference images not displayed]

CEFERINO

Indications

 23 weeks gestation of pregnancy
 Antenatal follow-up for nonvisualized fetal
 anatomy
 Advanced maternal age multigravida 36 by
 delivery, second trimester
 History of cesarean delivery, currently
 pregnant x 2
 Poor obstetrical history (SGA)
 Hepatitis B complicating pregnancy             O98.419,
 LR NIPS, Neg AFP, Neg Horizon
 Low lying placenta, antepartum (resolved)
Fetal Evaluation

 Num Of Fetuses:         1
 Fetal Heart Rate(bpm):  159
 Cardiac Activity:       Observed
 Presentation:           Breech
 Placenta:               Posterior
 P. Cord Insertion:      Visualized

 Amniotic Fluid
 AFI FV:      Within normal limits

                             Largest Pocket(cm)

Biometry
 BPD:     59.32  mm     G. Age:  24w 2d         82  %    CI:        72.64   %    70 - 86
                                                         FL/HC:      18.8   %    19.2 -
 HC:    221.35   mm     G. Age:  24w 1d         75  %    HC/AC:      1.24        1.05 -
 AC:    178.71   mm     G. Age:  22w 5d         29  %    FL/BPD:     70.0   %    71 - 87
 FL:      41.51  mm     G. Age:  23w 3d         50  %    FL/AC:      23.2   %    20 - 24
 CER:      27.2  mm     G. Age:  24w 2d         96  %
 LV:        3.7  mm
 CM:        6.3  mm

 Est. FW:     575  gm      1 lb 4 oz     47  %
OB History

 Gravidity:    3         Term:   2
 Living:       2
Gestational Age

 LMP:           23w 1d        Date:  10/15/21                 EDD:   07/22/22
 U/S Today:     23w 5d                                        EDD:   07/18/22
 Best:          23w 1d     Det. By:  LMP  (10/15/21)          EDD:   07/22/22
Anatomy

 Cranium:               Appears normal         Aortic Arch:            Appears normal
 Cavum:                 Appears normal         Ductal Arch:            Appears normal
 Ventricles:            Appears normal         Diaphragm:              Appears normal
 Choroid Plexus:        Appears normal         Stomach:                Appears normal, left
                                                                       sided
 Cerebellum:            Appears normal         Abdomen:                Appears normal
 Posterior Fossa:       Appears normal         Abdominal Wall:         Appears nml (cord
                                                                       insert, abd wall)
 Nuchal Fold:           Previously seen        Cord Vessels:           Appears normal (3
                                                                       vessel cord)
 Face:                  Appears normal         Kidneys:                Appear normal
                        (orbits and profile)
 Lips:                  Appears normal         Bladder:                Appears normal
 Thoracic:              Appears normal         Spine:                  Appears normal
 Heart:                 Appears normal         Upper Extremities:      Previously seen
                        (4CH, axis, and
                        situs)
 RVOT:                  Appears normal         Lower Extremities:      Previously seen
 LVOT:                  Appears normal

 Other:  Heels/feet, nasal bone, lenses, VC, 3VV and 3VTV visualized. Open
         hands/5th digits previously visualized. Female gender previously
         seen.
Cervix Uterus Adnexa

 Cervix
 Length:              3  cm.
 Normal appearance by transabdominal scan.

 Uterus
 No abnormality visualized.
 Right Ovary
 Within normal limits.

 Left Ovary
 Within normal limits.

 Cul De Sac
 No free fluid seen.

 Adnexa
 No abnormality visualized.
Impression

 Follow up growth to complete the fetal anatomy and due to
 prior small for gestational age.
 Normal interval growth with measurements consistent with
 dates
 Good fetal movement and amniotic fluid volume

 Anatomy completed and low lying placenta is resolved.
Recommendations

 Follow up growth in 5 weeks.

## 2024-04-21 ENCOUNTER — Encounter: Payer: Self-pay | Admitting: Emergency Medicine

## 2024-04-21 ENCOUNTER — Ambulatory Visit
Admission: EM | Admit: 2024-04-21 | Discharge: 2024-04-21 | Disposition: A | Discharge status: Home / Self Care | Attending: Physician Assistant | Admitting: Physician Assistant

## 2024-04-21 DIAGNOSIS — N898 Other specified noninflammatory disorders of vagina: Secondary | ICD-10-CM | POA: Diagnosis not present

## 2024-04-21 DIAGNOSIS — R300 Dysuria: Secondary | ICD-10-CM | POA: Diagnosis not present

## 2024-04-21 DIAGNOSIS — Z113 Encounter for screening for infections with a predominantly sexual mode of transmission: Secondary | ICD-10-CM | POA: Insufficient documentation

## 2024-04-21 DIAGNOSIS — J309 Allergic rhinitis, unspecified: Secondary | ICD-10-CM

## 2024-04-21 DIAGNOSIS — R350 Frequency of micturition: Secondary | ICD-10-CM | POA: Diagnosis not present

## 2024-04-21 DIAGNOSIS — R109 Unspecified abdominal pain: Secondary | ICD-10-CM | POA: Diagnosis not present

## 2024-04-21 DIAGNOSIS — A749 Chlamydial infection, unspecified: Secondary | ICD-10-CM | POA: Diagnosis not present

## 2024-04-21 LAB — POCT URINALYSIS DIP (MANUAL ENTRY)
Bilirubin, UA: NEGATIVE
Glucose, UA: NEGATIVE mg/dL
Ketones, POC UA: NEGATIVE mg/dL
Leukocytes, UA: NEGATIVE
Nitrite, UA: NEGATIVE
Protein Ur, POC: NEGATIVE mg/dL
Spec Grav, UA: 1.025 (ref 1.010–1.025)
Urobilinogen, UA: 0.2 U/dL
pH, UA: 6 (ref 5.0–8.0)

## 2024-04-21 LAB — POCT URINE PREGNANCY: Preg Test, Ur: NEGATIVE

## 2024-04-21 NOTE — ED Triage Notes (Addendum)
 Pt presents c/o urinary frequency and vaginal discomfort x 1 week. Pt reports a burning sensation after she uses the bathroom.

## 2024-04-22 LAB — URINE CULTURE: Culture: NO GROWTH

## 2024-04-23 ENCOUNTER — Ambulatory Visit (HOSPITAL_COMMUNITY): Payer: Self-pay

## 2024-04-23 ENCOUNTER — Encounter: Payer: Self-pay | Admitting: Physician Assistant

## 2024-04-23 NOTE — ED Provider Notes (Signed)
 [facility]    CSN: [id number] Arrival date & time: 04/21/24  9056      History   Chief Complaint Chief Complaint  Patient presents with   Urinary Frequency   vaginal discomfort   Abdominal Pain    HPI Jeanne Williams is a 38 y.o. female.   Patient here today for evaluation of urinary frequency and vaginal discomfort that started a week ago.  She reports of burning sensation after she use the bathroom.  She denies any abdominal pain or back pain.  She has not any nausea or vomiting.  She denies fever.  She also complains of some nasal congestion that she relates to allergies.  She is unsure what she should take for this.  The history is provided by the patient.  Urinary Frequency Pertinent negatives include no abdominal pain and no shortness of breath.  Abdominal Pain Associated symptoms: no chills, no fever, no nausea, no shortness of breath and no vomiting     Past Medical History:  Diagnosis Date   H/O bilateral salpingectomy 07/17/2022    Patient Active Problem List   Diagnosis Date Noted   H/O bilateral salpingectomy 07/17/2022   S/P repeat low transverse C-section 07/15/2022   Language barrier 05/05/2022   Chronic viral hepatitis B without delta-agent (HCC) 02/09/2022    Past Surgical History:  Procedure Laterality Date   CESAREAN SECTION     CESAREAN SECTION WITH BILATERAL TUBAL LIGATION Bilateral 07/15/2022   Procedure: CESAREAN SECTION WITH BILATERAL TUBAL LIGATION;  Surgeon: Eldonna Suzen Octave, MD;  Location: [facility];  Service: Obstetrics;  Laterality: Bilateral;    OB History     Gravida  3   Para  3   Term  3   Preterm  0   AB  0   Living  3      SAB  0   IAB  0   Ectopic  0   Multiple  0   Live Births  3            Home Medications    Prior to Admission medications   Medication Sig Start Date End Date Taking? Authorizing Provider  valACYclovir (VALTREX) 1000 MG tablet Take 1,000 mg by mouth daily.  02/24/24  Yes [provider]  acetaminophen  (TYLENOL ) 500 MG tablet Take 2 tablets (1,000 mg total) by mouth every 6 (six) hours. Patient not taking: Reported on 03/03/2023 07/29/22   Ervin, Michael L, MD  ferrous sulfate  (FERROUSUL) 325 (65 FE) MG tablet Take 1 tablet (325 mg total) by mouth every other day. Patient not taking: Reported on 03/03/2023 07/29/22   Ervin, Michael L, MD  ibuprofen  (ADVIL ) 600 MG tablet Take 1 tablet (600 mg total) by mouth every 6 (six) hours. Patient not taking: Reported on 09/14/2022 07/29/22   Ervin, Michael L, MD  oxyCODONE  (OXY IR/ROXICODONE ) 5 MG immediate release tablet Take 1-2 tablets (5-10 mg total) by mouth every 4 (four) hours as needed for moderate pain. Patient not taking: Reported on 09/14/2022 07/17/22   Autry-Lott, Rojean, DO  Prenatal 28-0.8 MG TABS Take 1 tablet by mouth daily. Patient not taking: Reported on 07/21/2022 01/20/22   Cleatus Moccasin, MD    Family History Family History  Problem Relation Age of Onset   Cancer Neg Hx    Diabetes Neg Hx    Hypertension Neg Hx    Stroke Neg Hx    Asthma Neg Hx     Social History Social History  Tobacco Use   Smoking status: Never    Passive exposure: Never   Smokeless tobacco: Never  Vaping Use   Vaping status: Never Used  Substance Use Topics   Alcohol use: Never   Drug use: Never     Allergies   Patient has no known allergies.   Review of Systems Review of Systems  Constitutional:  Negative for chills and fever.  Eyes:  Negative for discharge and redness.  Respiratory:  Negative for shortness of breath.   Gastrointestinal:  Negative for abdominal pain, nausea and vomiting.  Genitourinary:  Positive for frequency.     Physical Exam Triage Vital Signs ED Triage Vitals  Encounter Vitals Group     BP 04/21/24 1008 130/87     Girls Systolic BP Percentile --      Girls Diastolic BP Percentile --      Boys Systolic BP Percentile --      Boys Diastolic BP Percentile --       Pulse Rate 04/21/24 1008 63     Resp 04/21/24 1008 16     Temp 04/21/24 1008 98.1 F (36.7 C)     Temp Source 04/21/24 1008 Oral     SpO2 04/21/24 1008 99 %     Weight 04/21/24 1005 162 lb 14.7 oz (73.9 kg)     Height --      Head Circumference --      Peak Flow --      Pain Score 04/21/24 1000 0     Pain Loc --      Pain Education --      Exclude from Growth Chart --    No data found.  Updated Vital Signs BP 130/87 (BP Location: Left Arm)   Pulse 63   Temp 98.1 F (36.7 C) (Oral)   Resp 16   Wt 162 lb 14.7 oz (73.9 kg)   LMP 04/21/2024   SpO2 99%   Breastfeeding No   BMI 31.82 kg/m   Visual Acuity Right Eye Distance:   Left Eye Distance:   Bilateral Distance:    Right Eye Near:   Left Eye Near:    Bilateral Near:     Physical Exam Vitals and nursing note reviewed.  Constitutional:      General: She is not in acute distress.    Appearance: Normal appearance. She is not ill-appearing.  HENT:     Head: Normocephalic and atraumatic.   Eyes:     Conjunctiva/sclera: Conjunctivae normal.    Cardiovascular:     Rate and Rhythm: Normal rate.  Pulmonary:     Effort: Pulmonary effort is normal. No respiratory distress.   Neurological:     Mental Status: She is alert.   Psychiatric:        Mood and Affect: Mood normal.        Behavior: Behavior normal.        Thought Content: Thought content normal.      UC Treatments / Results  Labs (all labs ordered are listed, but only abnormal results are displayed) Labs Reviewed  POCT URINALYSIS DIP (MANUAL ENTRY) - Abnormal; Notable for the following components:      Result Value   Clarity, UA hazy (*)    Blood, UA large (*)    All other components within normal limits  CERVICOVAGINAL ANCILLARY ONLY - Abnormal; Notable for the following components:   Chlamydia Positive (*)    Bacterial Vaginitis (gardnerella) Positive (*)    All other components  within normal limits  POCT URINE PREGNANCY - Normal  URINE  CULTURE    EKG   Radiology No results found.  Procedures Procedures (including critical care time)  Medications Ordered in UC Medications - No data to display  Initial Impression / Assessment and Plan / UC Course  I have reviewed the triage vital signs and the nursing notes.  Pertinent labs & imaging results that were available during my care of the patient were reviewed by me and considered in my medical decision making (see chart for details).    UA without signs of UTI.  Will order urine culture.  STD screening also ordered.  Will await results further recommendation.  Advised that she trial daily Zyrtec for allergic rhinitis.  Encouraged follow-up if no gradual improvement or with any further concerns otherwise will await results.  Final Clinical Impressions(s) / UC Diagnoses   Final diagnoses:  Dysuria  Allergic rhinitis, unspecified seasonality, unspecified trigger  Vaginal itching   Discharge Instructions   None    ED Prescriptions   None    PDMP not reviewed this encounter.   Billy Asberry FALCON, PA-C 04/23/24 [phone]

## 2024-04-24 LAB — CERVICOVAGINAL ANCILLARY ONLY
Bacterial Vaginitis (gardnerella): POSITIVE — AB
Candida Glabrata: NEGATIVE
Candida Vaginitis: NEGATIVE
Chlamydia: POSITIVE — AB
Comment: NEGATIVE
Comment: NEGATIVE
Comment: NEGATIVE
Comment: NEGATIVE
Comment: NEGATIVE
Comment: NORMAL
Neisseria Gonorrhea: NEGATIVE
Trichomonas: NEGATIVE

## 2024-04-25 MED ORDER — DOXYCYCLINE HYCLATE 100 MG PO TABS: 100.0000 mg | ORAL_TABLET | Freq: Two times a day (BID) | ORAL | 0 refills | Status: AC

## 2024-06-21 ENCOUNTER — Other Ambulatory Visit (HOSPITAL_COMMUNITY)
Admission: RE | Admit: 2024-06-21 | Discharge: 2024-06-21 | Disposition: A | Discharge status: Home / Self Care | Source: Ambulatory Visit | Attending: Infectious Diseases | Admitting: Infectious Diseases

## 2024-06-21 ENCOUNTER — Ambulatory Visit: Payer: Self-pay | Admitting: Infectious Diseases

## 2024-06-21 ENCOUNTER — Other Ambulatory Visit: Payer: Self-pay

## 2024-06-21 ENCOUNTER — Encounter: Payer: Self-pay | Admitting: Infectious Diseases

## 2024-06-21 VITALS — BP 133/85 | HR 67 | Temp 98.0°F

## 2024-06-21 DIAGNOSIS — Z113 Encounter for screening for infections with a predominantly sexual mode of transmission: Secondary | ICD-10-CM | POA: Insufficient documentation

## 2024-06-21 DIAGNOSIS — A749 Chlamydial infection, unspecified: Secondary | ICD-10-CM | POA: Insufficient documentation

## 2024-06-21 DIAGNOSIS — B181 Chronic viral hepatitis B without delta-agent: Secondary | ICD-10-CM

## 2024-06-21 DIAGNOSIS — Z114 Encounter for screening for human immunodeficiency virus [HIV]: Secondary | ICD-10-CM

## 2024-06-21 HISTORY — DX: Chlamydial infection, unspecified: A74.9

## 2024-06-21 NOTE — Progress Notes (Signed)
 Patient: Jeanne Williams  DOB: Sep 26, 1986 MRN: [id number] PCP: Pcp, No   Reason for Visit: chronic hepatitis B    Chief Complaint  Patient presents with   Follow-up      Subjective   Subjective:   Chief Complaint  Patient presents with   Follow-up   HPI: Jeanne Williams is a 38 y.o. female with inactive chronic hepatitis B infection here for annual follow up.   LOV with Dr. Efrain in May 2024. Doing well at that time with VL > 2000 copies, LFTs were in normal range. Elastography last year revealed median kPa 3.0 and normal appearing liver tissue on u/s without mass.   Her husband is vaccinated and she asks about whether he needs another vaccine. Jeanne Williams (4 children).    Review of Systems  Constitutional:  Negative for chills, fever, malaise/fatigue and weight loss.  HENT:  Negative for sore throat.        No dental problems  Respiratory:  Negative for cough and sputum production.   Cardiovascular:  Negative for chest pain and leg swelling.  Gastrointestinal:  Negative for abdominal pain, diarrhea and vomiting.  Genitourinary:  Negative for dysuria and flank pain.  Musculoskeletal:  Negative for joint pain, myalgias and neck pain.  Skin:  Negative for rash.  Neurological:  Negative for dizziness, tingling and headaches.  Psychiatric/Behavioral:  Negative for depression and substance abuse. The patient is not nervous/anxious and does not have insomnia.     Past Medical History:  Diagnosis Date   H/O bilateral salpingectomy 07/17/2022    Outpatient Medications Prior to Visit  Medication Sig Dispense Refill   acetaminophen  (TYLENOL ) 500 MG tablet Take 2 tablets (1,000 mg total) by mouth every 6 (six) hours. (Patient not taking: Reported on 03/03/2023) 30 tablet 0   ferrous sulfate  (FERROUSUL) 325 (65 FE) MG tablet Take 1 tablet (325 mg total) by mouth every other day. (Patient not taking: Reported on 03/03/2023) 45 tablet 1   ibuprofen  (ADVIL ) 600 MG  tablet Take 1 tablet (600 mg total) by mouth every 6 (six) hours. (Patient not taking: Reported on 09/14/2022) 30 tablet 0   oxyCODONE  (OXY IR/ROXICODONE ) 5 MG immediate release tablet Take 1-2 tablets (5-10 mg total) by mouth every 4 (four) hours as needed for moderate pain. (Patient not taking: Reported on 09/14/2022) 30 tablet 0   Prenatal 28-0.8 MG TABS Take 1 tablet by mouth daily. (Patient not taking: Reported on 07/21/2022) 30 tablet 11   valACYclovir (VALTREX) 1000 MG tablet Take 1,000 mg by mouth daily. (Patient not taking: Reported on 06/21/2024)     No facility-administered medications prior to visit.     No Known Allergies  Social History   Tobacco Use   Smoking status: Never    Passive exposure: Never   Smokeless tobacco: Never  Vaping Use   Vaping status: Never Used  Substance Use Topics   Alcohol use: Never   Drug use: Never    Family History  Problem Relation Age of Onset   Cancer Neg Hx    Diabetes Neg Hx    Hypertension Neg Hx    Stroke Neg Hx    Asthma Neg Hx        Objective   Objective:   Vitals:   06/21/24 0902  BP: 133/85  Pulse: 67  Temp: 98 F (36.7 C)  TempSrc: Temporal  SpO2: 100%   There is no height or weight on file to calculate BMI.  Physical Exam Constitutional:      Appearance: Normal appearance. She is not ill-appearing.  HENT:     Mouth/Throat:     Mouth: Mucous membranes are moist.     Pharynx: Oropharynx is clear.  Eyes:     General: No scleral icterus. Cardiovascular:     Rate and Rhythm: Normal rate.  Pulmonary:     Effort: Pulmonary effort is normal.  Neurological:     Mental Status: She is oriented to person, place, and time.  Psychiatric:        Mood and Affect: Mood normal.        Thought Content: Thought content normal.       Assessment & Plan:   Problem List Items Addressed This Visit       Unprioritized   Chlamydia infection   Discussed at the visit today - I recommended we proceed with HIV Ab  and RPR screening to complete sexual health work up. We also spoke about possible PrEP with Viread taken daily - this would be something she would need to continue longer term given hepatitis b infection and dual action and prevent against HIV infection.  She appreciated the information but declined at this time.       Chronic viral hepatitis B without delta-agent (HCC) - Primary   Jeanne Williams continues to do well with inactive hepatitis B infection. She has not had any indications to start treatment over the review of her annual visits. Will update blood work for her today including Hep B DNA PCR, CMP, CBC, FibroTest.   We discussed natural progression of hepatitis b and the potential if not monitored appropriately to progress to cirrhosis and liver failure. Her children have all been vaccinated. Her husband has been vaccinated and I recommended he have an updated titer to ensure he does not need further immunizations.   She prefers to continue annual follow up which is reasonable until we need to start with more regular liver cancer screenings.        Relevant Orders   Hepatitis B DNA, ultraquantitative, PCR   COMPLETE METABOLIC PANEL WITHOUT GFR   CBC w/Diff   Liver Fibrosis, FibroTest-ActiTest   Other Visit Diagnoses       Encounter for screening for HIV       Relevant Orders   HIV antibody (with reflex)     Routine screening for STI (sexually transmitted infection)       Relevant Orders   RPR   Urine cytology ancillary only         Orders Placed This Encounter  Procedures   Hepatitis B DNA, ultraquantitative, PCR   RPR   HIV antibody (with reflex)   COMPLETE METABOLIC PANEL WITHOUT GFR   CBC w/Diff   Liver Fibrosis, FibroTest-ActiTest    No orders of the defined types were placed in this encounter.   Return in about 1 year (around 06/21/2025).   Corean Fireman, MSN, NP-C [facility]   [email or web address] Pager: [phone] Office: [phone] [facility] Main Line: [phone] *Secure Chat Communication Welcome

## 2024-06-21 NOTE — Assessment & Plan Note (Signed)
 Discussed at the visit today - I recommended we proceed with HIV Ab and RPR screening to complete sexual health work up. We also spoke about possible PrEP with Viread taken daily - this would be something she would need to continue longer term given hepatitis b infection and dual action and prevent against HIV infection.  She appreciated the information but declined at this time.

## 2024-06-21 NOTE — Patient Instructions (Addendum)
 Nice to meet you.   Please schedule a follow up in a year to check in again   The medication we talked about starting for you today is called Viread. It is one pill once a day   Hepatitis B Surface Antibody - this is the name of the test that your husband should have to measure his immunity against hepatitis B.

## 2024-06-21 NOTE — Assessment & Plan Note (Signed)
 Jeanne Williams continues to do well with inactive hepatitis B infection. She has not had any indications to start treatment over the review of her annual visits. Will update blood work for her today including Hep B DNA PCR, CMP, CBC, FibroTest.   We discussed natural progression of hepatitis b and the potential if not monitored appropriately to progress to cirrhosis and liver failure. Her children have all been vaccinated. Her husband has been vaccinated and I recommended he have an updated titer to ensure he does not need further immunizations.   She prefers to continue annual follow up which is reasonable until we need to start with more regular liver cancer screenings.

## 2024-06-22 ENCOUNTER — Ambulatory Visit: Payer: Self-pay | Admitting: Infectious Diseases

## 2024-06-22 LAB — URINE CYTOLOGY ANCILLARY ONLY
Chlamydia: NEGATIVE
Comment: NEGATIVE
Comment: NORMAL
Neisseria Gonorrhea: NEGATIVE

## 2024-06-27 LAB — HIV ANTIBODY (ROUTINE TESTING W REFLEX): HIV 1&2 Ab, 4th Generation: NONREACTIVE

## 2024-06-27 LAB — CBC WITH DIFFERENTIAL/PLATELET
Absolute Lymphocytes: 1346 {cells}/uL (ref 850–3900)
Absolute Monocytes: 342 {cells}/uL (ref 200–950)
Basophils Absolute: 29 {cells}/uL (ref 0–200)
Basophils Relative: 0.8 %
Eosinophils Absolute: 162 {cells}/uL (ref 15–500)
Eosinophils Relative: 4.5 %
HCT: 30.8 % — ABNORMAL LOW (ref 35.0–45.0)
Hemoglobin: 9.3 g/dL — ABNORMAL LOW (ref 11.7–15.5)
MCH: 25.3 pg — ABNORMAL LOW (ref 27.0–33.0)
MCHC: 30.2 g/dL — ABNORMAL LOW (ref 32.0–36.0)
MCV: 83.7 fL (ref 80.0–100.0)
MPV: 10.2 fL (ref 7.5–12.5)
Monocytes Relative: 9.5 %
Neutro Abs: 1721 {cells}/uL (ref 1500–7800)
Neutrophils Relative %: 47.8 %
Platelets: 281 Thousand/uL (ref 140–400)
RBC: 3.68 Million/uL — ABNORMAL LOW (ref 3.80–5.10)
RDW: 14.3 % (ref 11.0–15.0)
Total Lymphocyte: 37.4 %
WBC: 3.6 Thousand/uL — ABNORMAL LOW (ref 3.8–10.8)

## 2024-06-27 LAB — COMPLETE METABOLIC PANEL WITHOUT GFR
AG Ratio: 1.2 (calc) (ref 1.0–2.5)
ALT: 11 U/L (ref 6–29)
AST: 16 U/L (ref 10–30)
Albumin: 4.1 g/dL (ref 3.6–5.1)
Alkaline phosphatase (APISO): 61 U/L (ref 31–125)
BUN: 10 mg/dL (ref 7–25)
CO2: 24 mmol/L (ref 20–32)
Calcium: 8.8 mg/dL (ref 8.6–10.2)
Chloride: 106 mmol/L (ref 98–110)
Creat: 0.65 mg/dL (ref 0.50–0.97)
Globulin: 3.3 g/dL (ref 1.9–3.7)
Glucose, Bld: 84 mg/dL (ref 65–99)
Potassium: 4.1 mmol/L (ref 3.5–5.3)
Sodium: 136 mmol/L (ref 135–146)
Total Bilirubin: 0.4 mg/dL (ref 0.2–1.2)
Total Protein: 7.4 g/dL (ref 6.1–8.1)

## 2024-06-27 LAB — LIVER FIBROSIS, FIBROTEST-ACTITEST
ALT: 10 U/L (ref 6–29)
Alpha-2-Macroglobulin: 236 mg/dL (ref 106–279)
Apolipoprotein A1: 179 mg/dL (ref 101–198)
Bilirubin: 0.4 mg/dL (ref 0.2–1.2)
Fibrosis Score: 0.15
GGT: 13 U/L (ref 3–50)
Haptoglobin: 33 mg/dL — ABNORMAL LOW (ref 43–212)
Necroinflammat ACT Score: 0.02
Reference ID: 5666222

## 2024-06-27 LAB — HEPATITIS B DNA, ULTRAQUANTITATIVE, PCR
Hepatitis B DNA: 1830 [IU]/mL — ABNORMAL HIGH
Hepatitis B virus DNA: 3.26 {Log_IU}/mL — ABNORMAL HIGH

## 2024-06-27 LAB — RPR: RPR Ser Ql: NONREACTIVE

## 2024-07-17 ENCOUNTER — Ambulatory Visit: Payer: Self-pay | Admitting: Obstetrics and Gynecology

## 2024-08-24 ENCOUNTER — Ambulatory Visit: Payer: Self-pay | Admitting: Obstetrics and Gynecology

## 2024-09-29 ENCOUNTER — Other Ambulatory Visit: Payer: Self-pay

## 2024-09-29 ENCOUNTER — Emergency Department (HOSPITAL_COMMUNITY)
Admission: EM | Admit: 2024-09-29 | Discharge: 2024-09-29 | Disposition: A | Discharge status: Home / Self Care | Attending: Emergency Medicine | Admitting: Emergency Medicine

## 2024-09-29 ENCOUNTER — Emergency Department (HOSPITAL_COMMUNITY)

## 2024-09-29 DIAGNOSIS — M545 Low back pain, unspecified: Secondary | ICD-10-CM | POA: Insufficient documentation

## 2024-09-29 LAB — URINALYSIS, ROUTINE W REFLEX MICROSCOPIC
Bacteria, UA: NONE SEEN
Bilirubin Urine: NEGATIVE
Glucose, UA: NEGATIVE mg/dL
Ketones, ur: NEGATIVE mg/dL
Leukocytes,Ua: NEGATIVE
Nitrite: NEGATIVE
Protein, ur: NEGATIVE mg/dL
Specific Gravity, Urine: 1.009 (ref 1.005–1.030)
pH: 7 (ref 5.0–8.0)

## 2024-09-29 LAB — PREGNANCY, URINE: Preg Test, Ur: NEGATIVE

## 2024-09-29 MED ORDER — ACETAMINOPHEN 325 MG PO TABS
975.0000 mg | ORAL_TABLET | Freq: Once | ORAL | Status: AC
  Administered 2024-09-29: 975 mg via ORAL
  Filled 2024-09-29: qty 3

## 2024-09-29 MED ORDER — LIDOCAINE 5 % EX PTCH
1.0000 | MEDICATED_PATCH | CUTANEOUS | Status: DC
  Administered 2024-09-29: 1 via TRANSDERMAL
  Filled 2024-09-29: qty 1

## 2024-09-29 MED ORDER — METHOCARBAMOL 500 MG PO TABS: 500.0000 mg | ORAL_TABLET | Freq: Two times a day (BID) | ORAL | 0 refills | Status: AC | PRN

## 2024-09-29 MED ORDER — KETOROLAC TROMETHAMINE 15 MG/ML IJ SOLN
30.0000 mg | Freq: Once | INTRAMUSCULAR | Status: AC
  Administered 2024-09-29: 30 mg via INTRAMUSCULAR
  Filled 2024-09-29: qty 2

## 2024-09-29 NOTE — ED Triage Notes (Signed)
 Patient arrives via [facility] from home for back pain. Midline lower back without radiation. Pain increasing with movement, no trauma or falls. Patient was taking a shower when pain began yesterday. No pain like this before. No significant medical hx. Took tylenol  and naproxen yesterday but nothing today. Alert oriented x4. Unable to ambulate due to pain.   EMS vitals BP 128/70 HR 68 RR 18 98 on room air

## 2024-09-29 NOTE — Discharge Instructions (Signed)
 It was a pleasure taking care of you today. You were seen in the Emergency Department for evaluation of back pain. Your work-up was reassuring. Your CT/Xray/Labs showed acute abnormality or evidence of infection that would explain your pain.  As discussed, I believe you may have strained a muscle in your back, which is causing your pain.  You can continue to take Tylenol  and/or ibuprofen  every 6 hours as needed for pain.  I am giving you a prescription for Robaxin , which is a muscle relaxer and can make you drowsy.  Please only take this at night.  Exercise caution when driving or operating heavy machinery. Refer to the attached documentation for further management of your symptoms. Follow up with your PCP if your symptoms continue.  Please return to the ER if you experience chest pain, trouble breathing, intractable nausea/vomiting or any other life threatening illnesses.

## 2024-09-29 NOTE — ED Notes (Signed)
 Pt ambulated in hallway with stand by assistance. Complaints of pain when ambulating.

## 2024-09-29 NOTE — ED Notes (Signed)
Pt was given discharge instructions and verbalized understanding.

## 2024-09-29 NOTE — ED Provider Notes (Signed)
 [place] EMERGENCY DEPARTMENT AT [facility] Provider Note   CSN: [id number] Arrival date & time: 09/29/24  9081     Patient presents with: Back Pain   Jeanne Williams is a 38 y.o. female.  With a history of status post C-section and tubal ligation (2023) who presents to the ED for back pain.  Acute onset back pain started 2 days ago while patient was taking a warm bath.  No inciting falls traumas or other injuries.  Pain localized over midline and left lower lumbar region.  No prior episodes of similar pain.  Acetaminophen  and naproxen at home provided only mild relief.  Difficulty walking secondary to back pain.  No focal weakness in the legs loss of urine or bowel urinary retention loss of sensation.  No prior history of back surgeries and no IV drug use.    Back Pain      Prior to Admission medications   Medication Sig Start Date End Date Taking? Authorizing Provider  methocarbamol  (ROBAXIN ) 500 MG tablet Take 1 tablet (500 mg total) by mouth 2 (two) times daily as needed for muscle spasms. 09/29/24  Yes Dufour, Marry RAMAN, PA-C  acetaminophen  (TYLENOL ) 500 MG tablet Take 2 tablets (1,000 mg total) by mouth every 6 (six) hours. Patient not taking: Reported on 03/03/2023 07/29/22   Ervin, Trenita Hulme L, MD  ferrous sulfate  (FERROUSUL) 325 (65 FE) MG tablet Take 1 tablet (325 mg total) by mouth every other day. Patient not taking: Reported on 03/03/2023 07/29/22   Ervin, Blayde Bacigalupi L, MD  ibuprofen  (ADVIL ) 600 MG tablet Take 1 tablet (600 mg total) by mouth every 6 (six) hours. Patient not taking: Reported on 09/14/2022 07/29/22   Ervin, Tanvir Hipple L, MD  oxyCODONE  (OXY IR/ROXICODONE ) 5 MG immediate release tablet Take 1-2 tablets (5-10 mg total) by mouth every 4 (four) hours as needed for moderate pain. Patient not taking: Reported on 09/14/2022 07/17/22   Autry-Lott, Rojean, DO  Prenatal 28-0.8 MG TABS Take 1 tablet by mouth daily. Patient not taking: Reported on 07/21/2022 01/20/22    Cleatus Moccasin, MD  valACYclovir (VALTREX) 1000 MG tablet Take 1,000 mg by mouth daily. Patient not taking: Reported on 06/21/2024 02/24/24   [provider]    Allergies: Patient has no known allergies.    Review of Systems  Musculoskeletal:  Positive for back pain.    Updated Vital Signs BP 119/62 (BP Location: Left Arm)   Pulse (!) 58   Temp 98 F (36.7 C) (Oral)   Resp 16   Ht 5' (1.524 m)   Wt 74 kg   SpO2 100%   BMI 31.86 kg/m   Physical Exam Vitals and nursing note reviewed.  HENT:     Head: Normocephalic and atraumatic.  Eyes:     Pupils: Pupils are equal, round, and reactive to light.  Cardiovascular:     Rate and Rhythm: Normal rate and regular rhythm.  Pulmonary:     Effort: Pulmonary effort is normal.     Breath sounds: Normal breath sounds.  Abdominal:     Palpations: Abdomen is soft.     Tenderness: There is no abdominal tenderness.  Musculoskeletal:     Comments: 5 out of 5 motor strength bilateral upper and lower extremities Sensation intact light touch throughout lower extremities Tenderness to left paraspinal and midline lumbar region No step-off or deformity  Skin:    General: Skin is warm and dry.  Neurological:     Mental Status: She is alert.  Psychiatric:        Mood and Affect: Mood normal.     (all labs ordered are listed, but only abnormal results are displayed) Labs Reviewed  URINALYSIS, ROUTINE W REFLEX MICROSCOPIC - Abnormal; Notable for the following components:      Result Value   Hgb urine dipstick MODERATE (*)    All other components within normal limits  PREGNANCY, URINE    EKG: None  Radiology: DG Lumbar Spine Complete Result Date: 09/29/2024 EXAM: 4 OR MORE VIEW(S) XRAY OF THE LUMBAR SPINE 09/29/2024 11:53:00 AM COMPARISON: None available. CLINICAL HISTORY: Lower lumbar back pain FINDINGS: LUMBAR SPINE: BONES: Vertebral body heights are maintained. Alignment is normal. DISCS AND DEGENERATIVE CHANGES: No severe  degenerative changes. SOFT TISSUES: No acute abnormality. IMPRESSION: 1. No significant abnormality of the lumbar spine. Electronically signed by: Waddell Calk MD 09/29/2024 12:23 PM EST RP Workstation: [id number]     Procedures   Medications Ordered in the ED  lidocaine  (LIDODERM ) 5 % 1 patch (1 patch Transdermal Patch Applied 09/29/24 1029)  acetaminophen  (TYLENOL ) tablet 975 mg (975 mg Oral Given 09/29/24 1028)  ketorolac  (TORADOL ) 15 MG/ML injection 30 mg (30 mg Intramuscular Given 09/29/24 1028)    Clinical Course as of 09/29/24 1611  Sat Sep 29, 2024  1610 No evidence of abnormalities on lumbar spine.  Positive RBCs in the UA.  Could potentially be a kidney stone however pain well-controlled at this time.  Counseled patient on symptomatic management for potential kidney stone and musculoskeletal strain sprain.  And return precautions were discussed.  She will follow-up with her PCP [MP]    Clinical Course User Index [MP] Pamella Ozell LABOR, DO                                 Medical Decision Making 38 year old female with history as above presented to the ED for atraumatic back pain x 2 days.  Localized over left lower lumbar and midline lower lumbar region.  No red flag symptoms based on history or my physical examination.  No history of trauma or falls.  Difficulty with ambulation secondary to pain.  Over-the-counter analgesics have been ineffective at home.  Will trial lidocaine  patch Toradol  Tylenol  here and obtain lumbar x-ray.  No urinary symptoms reported vaginal discharge but will obtain UA to evaluate for UTI as potential cause.  No prior history of kidney stones but would consider if there is hematuria on UA.  Will also obtain pregnancy test while she is here in the department.  Amount and/or Complexity of Data Reviewed Labs: ordered. Radiology: ordered.  Risk OTC drugs. Prescription drug management.        Final diagnoses:  Acute low back pain, unspecified back pain  laterality, unspecified whether sciatica present    ED Discharge Orders          Ordered    methocarbamol  (ROBAXIN ) 500 MG tablet  2 times daily PRN        09/29/24 1312               Pamella Ozell LABOR, DO 09/29/24 1611

## 2024-10-15 ENCOUNTER — Ambulatory Visit: Payer: Self-pay | Admitting: Student

## 2024-10-15 ENCOUNTER — Telehealth: Payer: Self-pay | Admitting: Family Medicine

## 2024-10-15 NOTE — Telephone Encounter (Signed)
 Attempted to contact the patient to reschedule their appointment due to provider unavailability. Left a voicemail requesting that the patient return the call to the office.

## 2024-11-16 ENCOUNTER — Telehealth: Payer: Self-pay

## 2024-11-16 ENCOUNTER — Encounter: Payer: Self-pay | Admitting: Family Medicine

## 2024-11-16 ENCOUNTER — Ambulatory Visit: Payer: Self-pay | Admitting: Family Medicine

## 2024-11-16 ENCOUNTER — Other Ambulatory Visit: Payer: Self-pay

## 2024-11-16 VITALS — BP 122/84 | HR 106 | Ht 63.0 in | Wt 166.6 lb

## 2024-11-16 DIAGNOSIS — N946 Dysmenorrhea, unspecified: Secondary | ICD-10-CM

## 2024-11-16 DIAGNOSIS — B181 Chronic viral hepatitis B without delta-agent: Secondary | ICD-10-CM

## 2024-11-16 DIAGNOSIS — Z23 Encounter for immunization: Secondary | ICD-10-CM | POA: Diagnosis not present

## 2024-11-16 DIAGNOSIS — Z98891 History of uterine scar from previous surgery: Secondary | ICD-10-CM

## 2024-11-16 DIAGNOSIS — Z01419 Encounter for gynecological examination (general) (routine) without abnormal findings: Secondary | ICD-10-CM

## 2024-11-16 NOTE — Telephone Encounter (Signed)
 RN called patient to inform her that her pelvic ultrasound is scheduled for 11/23/24 at 1:00pm.  She needs to arrive at 12:45 to Entrance A of [facility].  Needs to drink 32 ounces of water  one hour before her test.  She verbalized understanding and had no questions at this time.    Waddell, RN

## 2024-11-16 NOTE — Assessment & Plan Note (Signed)
 Cancer screening: - Pap: up to date - Mammogram: n/a - Colonoscopy: n/a  Contraception: S/p BTL  Mood: Flowsheet Row Office Visit from 11/16/2024 in [facility] [employer] [facility]  PHQ-9 Total Score 1    Vaccinations: - Flu: given  Metabolic: - DM: not indicated - Lipids: not indicated  ID: - STI: offered, declined

## 2024-11-16 NOTE — Assessment & Plan Note (Signed)
 Suspect balls under her skin are fibrous scarring reaction to suture from her last cesarean and the closure of the subcutaneous space as it feels like it is above the fascia. Slightly enalrged uterus and no good non-pregnant imaging of uterus, so OK to get TVUS as well.

## 2024-11-16 NOTE — Assessment & Plan Note (Signed)
 Following w ID clinic

## 2024-11-16 NOTE — Progress Notes (Signed)
 "  GYNECOLOGY OFFICE VISIT NOTE  History:   Dandrea Medders is a 39 y.o. [id number] here today for annual visit.  Main concern is lumps in her belly, they are not painful Sometimes depending on movements though it does give her cramps   Health Maintenance Due  Topic Date Due   Pneumococcal Vaccine (1 of 2 - PCV) Never done   Hepatitis B Vaccines 19-59 Average Risk (1 of 3 - 19+ 3-dose series) 04/05/2006   Influenza Vaccine  05/25/2024   COVID-19 Vaccine (2 - 2025-26 season) 06/25/2024    Past Medical History:  Diagnosis Date   Chlamydia infection 06/21/2024   H/O bilateral salpingectomy 07/17/2022    Past Surgical History:  Procedure Laterality Date   CESAREAN SECTION     CESAREAN SECTION WITH BILATERAL TUBAL LIGATION Bilateral 07/15/2022   Procedure: CESAREAN SECTION WITH BILATERAL TUBAL LIGATION;  Surgeon: Eldonna Suzen Octave, MD;  Location: [facility] LD ORS;  Service: Obstetrics;  Laterality: Bilateral;    The following portions of the patient's history were reviewed and updated as appropriate: allergies, current medications, past family history, past medical history, past social history, past surgical history and problem list.   Health Maintenance:   Last pap: Result Date Procedure Results Follow-ups  03/04/2022 Cytology - PAP( [place]) High risk HPV: Negative Adequacy: Satisfactory for evaluation; transformation zone component ABSENT. Diagnosis: - Negative for intraepithelial lesion or malignancy (NILM) Comment: Normal Reference Range HPV - Negative HPV testing in 5 years: due 03/05/2027 Pap in 5 years: due 03/05/2027     Last mammogram:  N/a    Review of Systems:  Pertinent items noted in HPI and remainder of comprehensive ROS otherwise negative.  Physical Exam:  BP 122/84 (BP Location: Left Arm, Patient Position: Sitting)   Pulse (!) 106   Ht 5' 3 (1.6 m)   Wt 166 lb 9.6 oz (75.6 kg)   LMP 10/21/2024 (Approximate)   BMI 29.51 kg/m  Physical  Exam Constitutional:      General: She is not in acute distress.    Appearance: Normal appearance. She is not ill-appearing, toxic-appearing or diaphoretic.  HENT:     Head: Normocephalic and atraumatic.     Mouth/Throat:     Mouth: Mucous membranes are moist.  Eyes:     General: No scleral icterus. Pulmonary:     Effort: Pulmonary effort is normal. No respiratory distress.  Abdominal:     General: Abdomen is flat. There is no distension.     Palpations: Abdomen is soft.     Comments: Some subcentimeter round masses felt around L side just superior to c section scar. Uterus mildly enlarged and mildly ttp   Skin:    General: Skin is warm and dry.  Neurological:     General: No focal deficit present.     Mental Status: She is alert.     Gait: Gait normal.  Psychiatric:        Mood and Affect: Mood normal.        Behavior: Behavior normal.      Labs and Imaging No results found for this or any previous visit (from the past week). No results found.    Assessment and Plan:   Problem List Items Addressed This Visit       Digestive   Chronic viral hepatitis B without delta-agent (HCC)   Following w ID clinic        Other   S/P repeat low transverse C-section   Suspect balls under  her skin are fibrous scarring reaction to suture from her last cesarean and the closure of the subcutaneous space as it feels like it is above the fascia. Slightly enalrged uterus and no good non-pregnant imaging of uterus, so OK to get TVUS as well.       Well woman exam with routine gynecological exam - Primary   Cancer screening: - Pap: up to date - Mammogram: n/a - Colonoscopy: n/a  Contraception: S/p BTL  Mood: Flowsheet Row Office Visit from 11/16/2024 in [facility] [employer] [facility]  PHQ-9 Total Score 1    Vaccinations: - Flu: given  Metabolic: - DM: not indicated - Lipids: not indicated  ID: - STI: offered, declined      Relevant  Orders   Flu vaccine trivalent PF, 6mos and older(Flulaval,Afluria,Fluarix,Fluzone)   Other Visit Diagnoses       Dysmenorrhea       Relevant Orders   US  PELVIC COMPLETE WITH TRANSVAGINAL       Routine preventative health maintenance measures emphasized. Please refer to After Visit Summary for other counseling recommendations.   Return for pending results.    Total face-to-face time with patient: 20 minutes.  Over 50% of encounter was spent on counseling and coordination of care.   Donnice CHRISTELLA Carolus, MD/MPH Attending Family Medicine Physician, [facility] for [facility], [facility] "

## 2024-11-23 ENCOUNTER — Ambulatory Visit (HOSPITAL_COMMUNITY)

## 2024-11-29 ENCOUNTER — Ambulatory Visit (HOSPITAL_COMMUNITY): Admission: RE | Admit: 2024-11-29 | Discharge: 2024-11-29 | Attending: Family Medicine

## 2024-11-29 DIAGNOSIS — N946 Dysmenorrhea, unspecified: Secondary | ICD-10-CM

## 2024-11-30 ENCOUNTER — Ambulatory Visit: Payer: Self-pay | Admitting: Family Medicine

## 2024-12-06 ENCOUNTER — Other Ambulatory Visit (HOSPITAL_COMMUNITY)
